# Patient Record
Sex: Female | Born: 1945 | Race: White | Hispanic: No | Marital: Married | Smoking: Former smoker
Health system: Southern US, Community
[De-identification: ages and names within clinical notes are randomized; demographics above are authoritative.]

## PROBLEM LIST (undated history)

## (undated) DIAGNOSIS — G40909 Epilepsy, unspecified, not intractable, without status epilepticus: Secondary | ICD-10-CM

## (undated) HISTORY — PX: FRACTURE SURGERY: SHX138

---

## 2019-11-28 ENCOUNTER — Other Ambulatory Visit: Payer: Self-pay

## 2019-11-28 ENCOUNTER — Emergency Department: Payer: Medicare Other

## 2019-11-28 ENCOUNTER — Inpatient Hospital Stay
Admission: EM | Admit: 2019-11-28 | Discharge: 2019-12-03 | DRG: 871 | Disposition: A | Discharge status: Skilled Nursing Facility | Payer: Medicare Other | Attending: Internal Medicine | Admitting: Internal Medicine

## 2019-11-28 ENCOUNTER — Encounter: Payer: Self-pay | Admitting: *Deleted

## 2019-11-28 DIAGNOSIS — G40909 Epilepsy, unspecified, not intractable, without status epilepticus: Secondary | ICD-10-CM | POA: Diagnosis present

## 2019-11-28 DIAGNOSIS — G9341 Metabolic encephalopathy: Secondary | ICD-10-CM

## 2019-11-28 DIAGNOSIS — R6521 Severe sepsis with septic shock: Secondary | ICD-10-CM

## 2019-11-28 DIAGNOSIS — J9811 Atelectasis: Secondary | ICD-10-CM | POA: Diagnosis present

## 2019-11-28 DIAGNOSIS — J9601 Acute respiratory failure with hypoxia: Secondary | ICD-10-CM | POA: Diagnosis present

## 2019-11-28 DIAGNOSIS — I82409 Acute embolism and thrombosis of unspecified deep veins of unspecified lower extremity: Secondary | ICD-10-CM | POA: Diagnosis present

## 2019-11-28 DIAGNOSIS — Z87891 Personal history of nicotine dependence: Secondary | ICD-10-CM

## 2019-11-28 DIAGNOSIS — F0391 Unspecified dementia with behavioral disturbance: Secondary | ICD-10-CM

## 2019-11-28 DIAGNOSIS — E872 Acidosis, unspecified: Secondary | ICD-10-CM

## 2019-11-28 DIAGNOSIS — G8929 Other chronic pain: Secondary | ICD-10-CM | POA: Diagnosis present

## 2019-11-28 DIAGNOSIS — Z86718 Personal history of other venous thrombosis and embolism: Secondary | ICD-10-CM

## 2019-11-28 DIAGNOSIS — Z7901 Long term (current) use of anticoagulants: Secondary | ICD-10-CM

## 2019-11-28 DIAGNOSIS — E876 Hypokalemia: Secondary | ICD-10-CM | POA: Diagnosis not present

## 2019-11-28 DIAGNOSIS — E860 Dehydration: Secondary | ICD-10-CM | POA: Diagnosis present

## 2019-11-28 DIAGNOSIS — R6520 Severe sepsis without septic shock: Secondary | ICD-10-CM

## 2019-11-28 DIAGNOSIS — N179 Acute kidney failure, unspecified: Secondary | ICD-10-CM | POA: Diagnosis present

## 2019-11-28 DIAGNOSIS — N390 Urinary tract infection, site not specified: Secondary | ICD-10-CM | POA: Diagnosis present

## 2019-11-28 DIAGNOSIS — Z20822 Contact with and (suspected) exposure to covid-19: Secondary | ICD-10-CM | POA: Diagnosis present

## 2019-11-28 DIAGNOSIS — I82402 Acute embolism and thrombosis of unspecified deep veins of left lower extremity: Secondary | ICD-10-CM | POA: Diagnosis present

## 2019-11-28 DIAGNOSIS — D6862 Lupus anticoagulant syndrome: Secondary | ICD-10-CM

## 2019-11-28 DIAGNOSIS — I824Y9 Acute embolism and thrombosis of unspecified deep veins of unspecified proximal lower extremity: Secondary | ICD-10-CM

## 2019-11-28 DIAGNOSIS — Z8669 Personal history of other diseases of the nervous system and sense organs: Secondary | ICD-10-CM | POA: Diagnosis not present

## 2019-11-28 DIAGNOSIS — L8990 Pressure ulcer of unspecified site, unspecified stage: Secondary | ICD-10-CM | POA: Insufficient documentation

## 2019-11-28 DIAGNOSIS — B9620 Unspecified Escherichia coli [E. coli] as the cause of diseases classified elsewhere: Secondary | ICD-10-CM | POA: Diagnosis present

## 2019-11-28 DIAGNOSIS — Z87440 Personal history of urinary (tract) infections: Secondary | ICD-10-CM | POA: Diagnosis not present

## 2019-11-28 DIAGNOSIS — R9431 Abnormal electrocardiogram [ECG] [EKG]: Secondary | ICD-10-CM

## 2019-11-28 DIAGNOSIS — Z79899 Other long term (current) drug therapy: Secondary | ICD-10-CM

## 2019-11-28 DIAGNOSIS — D539 Nutritional anemia, unspecified: Secondary | ICD-10-CM | POA: Diagnosis present

## 2019-11-28 DIAGNOSIS — T420X5A Adverse effect of hydantoin derivatives, initial encounter: Secondary | ICD-10-CM | POA: Diagnosis present

## 2019-11-28 DIAGNOSIS — J811 Chronic pulmonary edema: Secondary | ICD-10-CM | POA: Diagnosis present

## 2019-11-28 DIAGNOSIS — F03918 Unspecified dementia, unspecified severity, with other behavioral disturbance: Secondary | ICD-10-CM | POA: Diagnosis present

## 2019-11-28 DIAGNOSIS — A419 Sepsis, unspecified organism: Principal | ICD-10-CM

## 2019-11-28 HISTORY — DX: Epilepsy, unspecified, not intractable, without status epilepticus: G40.909

## 2019-11-28 LAB — URINALYSIS, COMPLETE (UACMP) WITH MICROSCOPIC
Bilirubin Urine: NEGATIVE
Glucose, UA: NEGATIVE mg/dL
Ketones, ur: NEGATIVE mg/dL
Nitrite: NEGATIVE
Protein, ur: 100 mg/dL — AB
Specific Gravity, Urine: 1.012 (ref 1.005–1.030)
Squamous Epithelial / HPF: NONE SEEN (ref 0–5)
WBC, UA: >50 WBC/hpf — ABNORMAL HIGH (ref 0–5)
pH: 6 (ref 5.0–8.0)

## 2019-11-28 LAB — CBC WITH DIFFERENTIAL/PLATELET
Abs Immature Granulocytes: 0.03 10*3/uL (ref 0.00–0.07)
Basophils Absolute: 0 10*3/uL (ref 0.0–0.1)
Basophils Relative: 0 %
Eosinophils Absolute: 0 10*3/uL (ref 0.0–0.5)
Eosinophils Relative: 0 %
HCT: 35.8 % — ABNORMAL LOW (ref 36.0–46.0)
Hemoglobin: 11.6 g/dL — ABNORMAL LOW (ref 12.0–15.0)
Immature Granulocytes: 0 %
Lymphocytes Relative: 4 %
Lymphs Abs: 0.3 10*3/uL — ABNORMAL LOW (ref 0.7–4.0)
MCH: 34.2 pg — ABNORMAL HIGH (ref 26.0–34.0)
MCHC: 32.4 g/dL (ref 30.0–36.0)
MCV: 105.6 fL — ABNORMAL HIGH (ref 80.0–100.0)
Monocytes Absolute: 0.1 10*3/uL (ref 0.1–1.0)
Monocytes Relative: 1 %
Neutro Abs: 7.7 10*3/uL (ref 1.7–7.7)
Neutrophils Relative %: 95 %
Platelets: 367 10*3/uL (ref 150–400)
RBC: 3.39 MIL/uL — ABNORMAL LOW (ref 3.87–5.11)
RDW: 12.8 % (ref 11.5–15.5)
WBC: 8.2 10*3/uL (ref 4.0–10.5)
nRBC: 0 % (ref 0.0–0.2)

## 2019-11-28 LAB — COMPREHENSIVE METABOLIC PANEL
ALT: 43 U/L (ref 0–44)
AST: 87 U/L — ABNORMAL HIGH (ref 15–41)
Albumin: 3.7 g/dL (ref 3.5–5.0)
Alkaline Phosphatase: 145 U/L — ABNORMAL HIGH (ref 38–126)
Anion gap: 17 — ABNORMAL HIGH (ref 5–15)
BUN: 24 mg/dL — ABNORMAL HIGH (ref 8–23)
CO2: 19 mmol/L — ABNORMAL LOW (ref 22–32)
Calcium: 12.2 mg/dL — ABNORMAL HIGH (ref 8.9–10.3)
Chloride: 101 mmol/L (ref 98–111)
Creatinine, Ser: 1.45 mg/dL — ABNORMAL HIGH (ref 0.44–1.00)
GFR, Estimated: 38 mL/min — ABNORMAL LOW (ref >60)
Glucose, Bld: 94 mg/dL (ref 70–99)
Potassium: 4.7 mmol/L (ref 3.5–5.1)
Sodium: 137 mmol/L (ref 135–145)
Total Bilirubin: 0.8 mg/dL (ref 0.3–1.2)
Total Protein: 8.1 g/dL (ref 6.5–8.1)

## 2019-11-28 LAB — PROTIME-INR
INR: 1.4 — ABNORMAL HIGH (ref 0.8–1.2)
Prothrombin Time: 16.7 seconds — ABNORMAL HIGH (ref 11.4–15.2)

## 2019-11-28 LAB — LACTIC ACID, PLASMA
Lactic Acid, Venous: 5.7 mmol/L (ref 0.5–1.9)
Lactic Acid, Venous: 3.8 mmol/L (ref 0.5–1.9)

## 2019-11-28 LAB — APTT: aPTT: <24 seconds — ABNORMAL LOW (ref 24–36)

## 2019-11-28 MED ORDER — IOHEXOL 350 MG/ML SOLN
60.0000 mL | Freq: Once | INTRAVENOUS | Status: AC | PRN
  Administered 2019-11-28: 60 mL via INTRAVENOUS

## 2019-11-28 MED ORDER — ACETAMINOPHEN 650 MG RE SUPP
650.0000 mg | Freq: Four times a day (QID) | RECTAL | Status: DC | PRN
  Administered 2019-11-29: 650 mg via RECTAL
  Filled 2019-11-28: qty 1

## 2019-11-28 MED ORDER — ACETAMINOPHEN 650 MG RE SUPP
650.0000 mg | Freq: Once | RECTAL | Status: AC
  Administered 2019-11-28: 650 mg via RECTAL
  Filled 2019-11-28: qty 1

## 2019-11-28 MED ORDER — VANCOMYCIN HCL IN DEXTROSE 1-5 GM/200ML-% IV SOLN
1000.0000 mg | Freq: Once | INTRAVENOUS | Status: AC
  Administered 2019-11-28: 1000 mg via INTRAVENOUS
  Filled 2019-11-28: qty 200

## 2019-11-28 MED ORDER — HEPARIN BOLUS VIA INFUSION
2000.0000 [IU] | Freq: Once | INTRAVENOUS | Status: AC
  Administered 2019-11-29: 2000 [IU] via INTRAVENOUS
  Filled 2019-11-28: qty 2000

## 2019-11-28 MED ORDER — SODIUM CHLORIDE 0.9 % IV SOLN
2.0000 g | Freq: Once | INTRAVENOUS | Status: AC
  Administered 2019-11-28: 2 g via INTRAVENOUS
  Filled 2019-11-28: qty 2

## 2019-11-28 MED ORDER — ACETAMINOPHEN 325 MG PO TABS: 650.0000 mg | ORAL_TABLET | Freq: Four times a day (QID) | ORAL | Status: DC | PRN

## 2019-11-28 MED ORDER — PHENOBARBITAL 32.4 MG PO TABS
64.8000 mg | ORAL_TABLET | Freq: Two times a day (BID) | ORAL | Status: DC
  Filled 2019-11-28: qty 2

## 2019-11-28 MED ORDER — HEPARIN (PORCINE) 25000 UT/250ML-% IV SOLN
1300.0000 [IU]/h | INTRAVENOUS | Status: DC
  Administered 2019-11-29: 1200 [IU]/h via INTRAVENOUS
  Administered 2019-11-29: 1100 [IU]/h via INTRAVENOUS
  Administered 2019-11-30: 900 [IU]/h via INTRAVENOUS
  Administered 2019-12-01: 1050 [IU]/h via INTRAVENOUS
  Administered 2019-12-02: 1100 [IU]/h via INTRAVENOUS
  Filled 2019-11-28 (×5): qty 250

## 2019-11-28 MED ORDER — LACTATED RINGERS IV BOLUS (SEPSIS)
2000.0000 mL | Freq: Once | INTRAVENOUS | Status: AC
  Administered 2019-11-28: 2000 mL via INTRAVENOUS

## 2019-11-28 MED ORDER — ONDANSETRON HCL 4 MG/2ML IJ SOLN: 4.0000 mg | Freq: Once | INTRAMUSCULAR | Status: DC

## 2019-11-28 MED ORDER — ONDANSETRON HCL 4 MG/2ML IJ SOLN
4.0000 mg | Freq: Once | INTRAMUSCULAR | Status: AC
  Administered 2019-11-28: 4 mg via INTRAVENOUS

## 2019-11-28 MED ORDER — METRONIDAZOLE IN NACL 5-0.79 MG/ML-% IV SOLN
500.0000 mg | Freq: Once | INTRAVENOUS | Status: AC
  Administered 2019-11-28: 500 mg via INTRAVENOUS
  Filled 2019-11-28: qty 100

## 2019-11-28 MED ORDER — CINACALCET HCL 30 MG PO TABS
30.0000 mg | ORAL_TABLET | Freq: Two times a day (BID) | ORAL | Status: DC
  Administered 2019-11-29: 30 mg via ORAL
  Filled 2019-11-28 (×6): qty 1

## 2019-11-28 MED ORDER — SODIUM CHLORIDE 0.9 % IV SOLN: INTRAVENOUS | Status: DC

## 2019-11-28 MED ORDER — METRONIDAZOLE IN NACL 5-0.79 MG/ML-% IV SOLN: 500.0000 mg | Freq: Three times a day (TID) | INTRAVENOUS | Status: DC

## 2019-11-28 MED ORDER — ONDANSETRON HCL 4 MG/2ML IJ SOLN
INTRAMUSCULAR | Status: AC
  Filled 2019-11-28: qty 2

## 2019-11-28 MED ORDER — PHENYTOIN SODIUM EXTENDED 100 MG PO CAPS: 100.0000 mg | ORAL_CAPSULE | Freq: Two times a day (BID) | ORAL | Status: DC

## 2019-11-28 MED ORDER — MIDODRINE HCL 5 MG PO TABS
10.0000 mg | ORAL_TABLET | Freq: Once | ORAL | Status: DC
  Filled 2019-11-28: qty 2

## 2019-11-28 MED ORDER — SODIUM CHLORIDE 0.9 % IV BOLUS
500.0000 mL | Freq: Once | INTRAVENOUS | Status: AC
  Administered 2019-11-28: 500 mL via INTRAVENOUS

## 2019-11-28 NOTE — ED Notes (Signed)
Pt continues to moan out.

## 2019-11-28 NOTE — ED Provider Notes (Signed)
[facility] Emergency Department Provider Note  ____________________________________________   First MD Initiated Contact with Patient 11/28/19 1835     (approximate)  I have reviewed the triage vital signs and the nursing notes.   HISTORY  Chief Complaint Fever    HPI Shannon Kelley is a 74 y.o. female  With reported PMHx recent diagnosis of DVT of LLE, here from [facility] with sob, hypotension. Per report, pt has been c/o leg pain more than usual over the past several days. Today, she has now become febrile, tachycardic, and hypotensive with [facility]. They checked her O2 and it was in the 70s, so EMS was called and pt brought here. With EMS, pt was hypoxic, hypotensive and altered. On my assessment, she only complains of left leg pain which is "horrible." Unable to further elaborate. Denies any CP. She has been vomiting per EMS as well, though she does not report this on interview. Denies overt abd pain.  Level 5 caveat invoked as remainder of history, ROS, and physical exam limited due to patient's confusion, acuity.        History reviewed. No pertinent past medical history.   Dementia Frequent UTIs H/o DVT Lupus anticoagulant syndrome  PSHx:  Non contributory  FHx: Lupus anticoagulant  There are no problems to display for this patient.    Prior to Admission medications   Not on File    Allergies Patient has no known allergies.  No family history on file.  Social History Social History   Tobacco Use  . Smoking status: Never Smoker  . Smokeless tobacco: Never Used  Substance Use Topics  . Alcohol use: Not Currently  . Drug use: Not Currently    Review of Systems  Review of Systems  Unable to perform ROS: Mental status change  Constitutional: Positive for chills and fever.  Gastrointestinal: Positive for vomiting.  Musculoskeletal: Positive for arthralgias and gait problem.  Neurological: Positive for weakness.      ____________________________________________  PHYSICAL EXAM:      VITAL SIGNS: ED Triage Vitals  Enc Vitals Group     BP      Pulse      Resp      Temp      Temp src      SpO2      Weight      Height      Head Circumference      [facility] Flow      Pain Score      Pain Loc      Pain Edu?      Excl. in [place]?      Physical Exam Vitals and nursing note reviewed.  Constitutional:      General: She is not in acute distress.    Appearance: She is well-developed. She is obese. She is ill-appearing.  HENT:     Head: Normocephalic and atraumatic.     Mouth/Throat:     Mouth: Mucous membranes are dry.  Eyes:     Conjunctiva/sclera: Conjunctivae normal.  Cardiovascular:     Rate and Rhythm: Regular rhythm. Tachycardia present.     Heart sounds: Normal heart sounds. No murmur heard.  No friction rub.  Pulmonary:     Effort: Tachypnea and respiratory distress present.     Breath sounds: Normal breath sounds. No wheezing or rales.  Abdominal:     General: There is no distension.     Palpations: Abdomen is soft.     Tenderness:  There is generalized abdominal tenderness.  Musculoskeletal:     Cervical back: Neck supple.     Comments: TTP over bilateral LE, slightly worse on left. No overt asymmetric edema. No redness. Pulses 2+ and symmetric.  Skin:    General: Skin is warm.     Capillary Refill: Capillary refill takes less than 2 seconds.  Neurological:     Mental Status: She is alert and oriented to person, place, and time.     Motor: No abnormal muscle tone.       ____________________________________________   LABS (all labs ordered are listed, but only abnormal results are displayed)  Labs Reviewed  LACTIC ACID, PLASMA - Abnormal; Notable for the following components:      Result Value   Lactic Acid, Venous 5.7 (*)    All other components within normal limits  COMPREHENSIVE METABOLIC PANEL - Abnormal; Notable for the following components:   CO2 19 (*)    BUN 24  (*)    Creatinine, Ser 1.45 (*)    Calcium 12.2 (*)    AST 87 (*)    Alkaline Phosphatase 145 (*)    GFR, Estimated 38 (*)    Anion gap 17 (*)    All other components within normal limits  CBC WITH DIFFERENTIAL/PLATELET - Abnormal; Notable for the following components:   RBC 3.39 (*)    Hemoglobin 11.6 (*)    HCT 35.8 (*)    MCV 105.6 (*)    MCH 34.2 (*)    Lymphs Abs 0.3 (*)    All other components within normal limits  PROTIME-INR - Abnormal; Notable for the following components:   Prothrombin Time 16.7 (*)    INR 1.4 (*)    All other components within normal limits  APTT - Abnormal; Notable for the following components:   aPTT <24 (*)    All other components within normal limits  URINALYSIS, COMPLETE (UACMP) WITH MICROSCOPIC - Abnormal; Notable for the following components:   Color, Urine YELLOW (*)    APPearance TURBID (*)    Hgb urine dipstick MODERATE (*)    Protein, ur 100 (*)    Leukocytes,Ua MODERATE (*)    WBC, UA >50 (*)    Bacteria, UA MANY (*)    All other components within normal limits  CULTURE, BLOOD (ROUTINE X 2)  CULTURE, BLOOD (ROUTINE X 2)  URINE CULTURE  LACTIC ACID, PLASMA    ____________________________________________  EKG: Sinus tachycardia vs SVT, VR 153. QRS 76, QTc 506. Non-specific TW changes diffusely. No overt ST elevations. Findings c/w strain pattern. ________________________________________  RADIOLOGY All imaging, including plain films, CT scans, and ultrasounds, independently reviewed by me, and interpretations confirmed via formal radiology reads.  ED MD interpretation:   CT Angio Chest: Possible old PE, no acute PE identified, no PNA CT A/P: No surgical abnormality, non calcified gallstone noted  Official radiology report(s): CT Angio Chest PE W and/or Wo Contrast  Result Date: 11/28/2019 CLINICAL DATA:  Fever, short of breath, abdominal pain EXAM: CT ANGIOGRAPHY CHEST CT ABDOMEN AND PELVIS WITH CONTRAST TECHNIQUE:  Multidetector CT imaging of the chest was performed using the standard protocol during bolus administration of intravenous contrast. Multiplanar CT image reconstructions and MIPs were obtained to evaluate the vascular anatomy. Multidetector CT imaging of the abdomen and pelvis was performed using the standard protocol during bolus administration of intravenous contrast. CONTRAST:  23m OMNIPAQUE IOHEXOL 350 MG/ML SOLN COMPARISON:  01/03/2009, 11/28/2019 FINDINGS: CTA CHEST FINDINGS Cardiovascular: This is a  technically adequate evaluation of the pulmonary vasculature. There are 2 small eccentric filling defects within the left lower lobe segmental pulmonary arteries, which have an appearance most consistent with sequela of chronic thromboembolic disease. This is best seen on image 132 of series 5 and image 141 of series 5. No other signs of additional filling defects or acute pulmonary emboli. The heart is unremarkable without pericardial effusion. Mediastinum/Nodes: No enlarged mediastinal, hilar, or axillary lymph nodes. Thyroid gland, trachea, and esophagus demonstrate no significant findings. Lungs/Pleura: No acute airspace disease, effusion, or pneumothorax. Scattered hypoventilatory changes versus scarring at the lung bases. Central airways are patent. Musculoskeletal: No acute displaced fracture. Sclerosis and invagination of the superior endplate of X44 consistent with chronic compression fracture. Reconstructed images demonstrate no additional findings. Review of the MIP images confirms the above findings. CT ABDOMEN and PELVIS FINDINGS Hepatobiliary: No focal liver abnormality is seen. Noncalcified gallstones seen within the gallbladder neck, best visualized on delayed images. No gallbladder wall thickening or signs of acute cholecystitis. Pancreas: Unremarkable. No pancreatic ductal dilatation or surrounding inflammatory changes. Spleen: Normal in size without focal abnormality. Adrenals/Urinary Tract:  Small left renal cortical cysts are noted. Mild bilateral renal cortical atrophy. Punctate calcifications are seen at the renal hila, likely vascular. No definite urinary tract calculi or obstructive uropathy. The adrenals and bladder are unremarkable. Stomach/Bowel: No bowel obstruction or ileus. Normal appendix right lower quadrant. No bowel wall thickening or inflammatory change. Vascular/Lymphatic: Aortic atherosclerosis. No enlarged abdominal or pelvic lymph nodes. Reproductive: Uterus and bilateral adnexa are unremarkable. Other: No free fluid or free gas. No abdominal wall hernia. Musculoskeletal: Chronic appearing compression deformities are seen at T11 and L1. There are no acute displaced fractures. Bilateral hip arthroplasties are unremarkable. Reconstructed images demonstrate no additional findings. Review of the MIP images confirms the above findings. IMPRESSION: 1. Small eccentric filling defects within the left lower lobe segmental pulmonary arteries, which have an appearance most consistent with sequela of chronic thromboembolic disease. No signs of acute pulmonary emboli. 2. Noncalcified gallstone.  No evidence of acute cholecystitis. 3. Aortic Atherosclerosis (ICD10-I70.0). Electronically Signed   By: Randa Ngo M.D.   On: 11/28/2019 20:21   CT ABDOMEN PELVIS W CONTRAST  Result Date: 11/28/2019 CLINICAL DATA:  Fever, short of breath, abdominal pain EXAM: CT ANGIOGRAPHY CHEST CT ABDOMEN AND PELVIS WITH CONTRAST TECHNIQUE: Multidetector CT imaging of the chest was performed using the standard protocol during bolus administration of intravenous contrast. Multiplanar CT image reconstructions and MIPs were obtained to evaluate the vascular anatomy. Multidetector CT imaging of the abdomen and pelvis was performed using the standard protocol during bolus administration of intravenous contrast. CONTRAST:  52m OMNIPAQUE IOHEXOL 350 MG/ML SOLN COMPARISON:  01/03/2009, 11/28/2019 FINDINGS: CTA CHEST  FINDINGS Cardiovascular: This is a technically adequate evaluation of the pulmonary vasculature. There are 2 small eccentric filling defects within the left lower lobe segmental pulmonary arteries, which have an appearance most consistent with sequela of chronic thromboembolic disease. This is best seen on image 132 of series 5 and image 141 of series 5. No other signs of additional filling defects or acute pulmonary emboli. The heart is unremarkable without pericardial effusion. Mediastinum/Nodes: No enlarged mediastinal, hilar, or axillary lymph nodes. Thyroid gland, trachea, and esophagus demonstrate no significant findings. Lungs/Pleura: No acute airspace disease, effusion, or pneumothorax. Scattered hypoventilatory changes versus scarring at the lung bases. Central airways are patent. Musculoskeletal: No acute displaced fracture. Sclerosis and invagination of the superior endplate of TY18consistent with chronic compression  fracture. Reconstructed images demonstrate no additional findings. Review of the MIP images confirms the above findings. CT ABDOMEN and PELVIS FINDINGS Hepatobiliary: No focal liver abnormality is seen. Noncalcified gallstones seen within the gallbladder neck, best visualized on delayed images. No gallbladder wall thickening or signs of acute cholecystitis. Pancreas: Unremarkable. No pancreatic ductal dilatation or surrounding inflammatory changes. Spleen: Normal in size without focal abnormality. Adrenals/Urinary Tract: Small left renal cortical cysts are noted. Mild bilateral renal cortical atrophy. Punctate calcifications are seen at the renal hila, likely vascular. No definite urinary tract calculi or obstructive uropathy. The adrenals and bladder are unremarkable. Stomach/Bowel: No bowel obstruction or ileus. Normal appendix right lower quadrant. No bowel wall thickening or inflammatory change. Vascular/Lymphatic: Aortic atherosclerosis. No enlarged abdominal or pelvic lymph nodes.  Reproductive: Uterus and bilateral adnexa are unremarkable. Other: No free fluid or free gas. No abdominal wall hernia. Musculoskeletal: Chronic appearing compression deformities are seen at T11 and L1. There are no acute displaced fractures. Bilateral hip arthroplasties are unremarkable. Reconstructed images demonstrate no additional findings. Review of the MIP images confirms the above findings. IMPRESSION: 1. Small eccentric filling defects within the left lower lobe segmental pulmonary arteries, which have an appearance most consistent with sequela of chronic thromboembolic disease. No signs of acute pulmonary emboli. 2. Noncalcified gallstone.  No evidence of acute cholecystitis. 3. Aortic Atherosclerosis (ICD10-I70.0). Electronically Signed   By: Randa Ngo M.D.   On: 11/28/2019 20:21   DG Chest Port 1 View  Result Date: 11/28/2019 CLINICAL DATA:  Questionable sepsis.  Evaluate for pneumonia. EXAM: PORTABLE CHEST 1 VIEW COMPARISON:  03/30/2017 FINDINGS: The stable cardiomediastinal contours. No pleural effusion identified. No airspace consolidation identified to suggest pneumonia. Healing left distal clavicle fracture. IMPRESSION: No active disease. Electronically Signed   By: Kerby Moors M.D.   On: 11/28/2019 19:31    ____________________________________________  PROCEDURES   Procedure(s) performed (including Critical Care):  .Critical Care Performed by: Duffy Bruce, MD Authorized by: Duffy Bruce, MD   Critical care provider statement:    Critical care time (minutes):  35   Critical care time was exclusive of:  Separately billable procedures and treating other patients and teaching time   Critical care was necessary to treat or prevent imminent or life-threatening deterioration of the following conditions:  Cardiac failure, circulatory failure and respiratory failure   Critical care was time spent personally by me on the following activities:  Development of treatment  plan with patient or surrogate, discussions with consultants, evaluation of patient's response to treatment, examination of patient, obtaining history from patient or surrogate, ordering and performing treatments and interventions, ordering and review of laboratory studies, ordering and review of radiographic studies, pulse oximetry, re-evaluation of patient's condition and review of old charts   I assumed direction of critical care for this patient from another provider in my specialty: no      ____________________________________________  INITIAL IMPRESSION / MDM / [place] / ED COURSE  As part of my medical decision making, I reviewed the following data within the [facility] notes reviewed and incorporated, Old chart reviewed, Notes from prior ED visits, and [place] Controlled Substance Database       *LADAYSHA SOUTAR was evaluated in Emergency Department on 11/28/2019 for the symptoms described in the history of present illness. She was evaluated in the context of the global COVID-19 pandemic, which necessitated consideration that the patient might be at risk for infection with the SARS-CoV-2 virus that causes COVID-19.  Institutional protocols and algorithms that pertain to the evaluation of patients at risk for COVID-19 are in a state of rapid change based on information released by regulatory bodies including the CDC and federal and state organizations. These policies and algorithms were followed during the patient's care in the ED.  Some ED evaluations and interventions may be delayed as a result of limited staffing during the pandemic.*     Medical Decision Making: 74 year old female here with reported altered mental status, fever, and shortness of breath.  On arrival, patient febrile, tachycardic, hypotensive.  Concern for severe sepsis or septic shock.  Patient initiated on broad-spectrum antibiotics with 30 cc/kg fluid resuscitation.  Initial lab work  shows normal white blood cell count but significant lactic acidosis and likely mild AKI.  Slight AST and alk phos elevation noted.  Abdomen is soft.  UA is consistent with UTI and she has a history of recurrent UTIs.  Chest x-ray without pneumonia.  Given the degree of her lactic acidosis and discomfort, CTs of the chest as well as abdomen and pelvis obtained, and showed no evidence of surgical abnormality.  She is no evidence of acute PE.  Her primary complaint of leg pain is likely due to her known DVT and she has no evidence to suggest phlegmasia or vascular compromise in this extremity.  Will continue fluid resuscitation, antibiotics, and admit.  On tele, pt initially with sinus tach HR 150s, improving to 100s after fluids and fever control.  ____________________________________________  FINAL CLINICAL IMPRESSION(S) / ED DIAGNOSES  Final diagnoses:  Sepsis secondary to UTI ([place])  AKI (acute kidney injury) ([place])  Lactic acidosis  Lupus anticoagulant syndrome ([place])     MEDICATIONS GIVEN DURING THIS VISIT:  Medications  midodrine (PROAMATINE) tablet 10 mg (has no administration in time range)  lactated ringers bolus 2,000 mL (2,000 mLs Intravenous New Bag/Given 11/28/19 1845)  ceFEPIme (MAXIPIME) 2 g in sodium chloride 0.9 % 100 mL IVPB (0 g Intravenous Stopped 11/28/19 1930)  metroNIDAZOLE (FLAGYL) IVPB 500 mg (0 mg Intravenous Stopped 11/28/19 1930)  vancomycin (VANCOCIN) IVPB 1000 mg/200 mL premix (0 mg Intravenous Stopped 11/28/19 2054)  acetaminophen (TYLENOL) suppository 650 mg (650 mg Rectal Given 11/28/19 1847)  sodium chloride 0.9 % bolus 500 mL (0 mLs Intravenous Stopped 11/28/19 2055)  ondansetron (ZOFRAN) injection 4 mg (4 mg Intravenous Given 11/28/19 1932)  iohexol (OMNIPAQUE) 350 MG/ML injection 60 mL (60 mLs Intravenous Contrast Given 11/28/19 1950)     ED Discharge Orders    None       Note:  This document was prepared using Dragon voice recognition software and  may include unintentional dictation errors.   Duffy Bruce, MD 11/28/19 2131

## 2019-11-28 NOTE — ED Notes (Signed)
Report off to zach rn  

## 2019-11-28 NOTE — ED Notes (Signed)
U/s in with pt.  Pt continues to moan out.  Iv fluids infusing.

## 2019-11-28 NOTE — ED Notes (Signed)
Pt awake and moaning out out.  Answers questions appr.  Iv fluids and meds infusing.  siderails up x 2   Sinus tach on monitor.

## 2019-11-28 NOTE — Progress Notes (Signed)
CODE SEPSIS - PHARMACY COMMUNICATION  **Broad Spectrum Antibiotics should be administered within 1 hour of Sepsis diagnosis**  Time Code Sepsis Called/Page Received: 1837  Antibiotics Ordered:  Vancomycin 1000mg  IV x1 Cefepime 2g IV x1 Metronidazole 500mg  IV x1  Time of 1st antibiotic administration:    , PharmD Pharmacy Resident  11/28/2019 6:41 PM

## 2019-11-28 NOTE — ED Triage Notes (Signed)
Pt brought in via [facility] from [facility] with fever and sob.  Pt moaning and yelling out on arrival   nonrebreather in place.  No iv on arrival  Fever 102 per ems  md at bedside

## 2019-11-28 NOTE — Progress Notes (Signed)
ANTICOAGULATION CONSULT NOTE - Initial Consult  Pharmacy Consult for Heparin Indication: VTE treatment  No Known Allergies  Patient Measurements: Height: 5\' 6"  (167.6 cm) Weight: 74.8 kg (165 lb) IBW/kg (Calculated) : 59.3 HEPARIN DW (KG): 74.3  Vital Signs: Temp: 100.1 F (37.8 C) (11/10 2136) Temp Source: Oral (11/10 2136) BP: 135/125 (11/10 2145) Pulse Rate: 114 (11/10 2145)  Labs: Recent Labs    11/28/19 1836  HGB 11.6*  HCT 35.8*  PLT 367  APTT <24*  LABPROT 16.7*  INR 1.4*  CREATININE 1.45*   Estimated Creatinine Clearance: 35.2 mL/min (A) (by C-G formula based on SCr of 1.45 mg/dL (H)).  Medical History: Past Medical History:  Diagnosis Date  . Epilepsy (HCC)     Medications:  (Not in a hospital admission)   Assessment: Baseline INR = 1.4, aPTT = < 24, H/H low, PLTs OK.  Initiating Heparin for VTE treatment.  Pt has been on Warfarin  Goal of Therapy:  Heparin level 0.3-0.7 units/ml Monitor platelets by anticoagulation protocol: Yes   Plan:  Heparin bolus 2000 units x 1 then infusion at 1200 units/hr Check HL in 8 hours CBC daily per protocol  13/10/21 A 11/28/2019,10:26 PM

## 2019-11-28 NOTE — H&P (Signed)
Shannon Kelley [facility]:[id number] DOB: [birth year] DOA: 11/28/2019   PCP: Chelsea Primus, MD        Patient arrived to ER on 11/28/19 at 1831 Referred by Attending Duffy Bruce, MD   Patient coming from:   From facility  [facility]  Chief Complaint:  Chief Complaint  Patient presents with  . Fever    HPI: Shannon Kelley is a 74 y.o. 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, 1988, 1992)  Dementia     Presented with   fever shortness of breath moaning yelling out Started on non-rebreather Noted to be fever up to 102 per EMS Notes stated that patient has been complaining of of leg pain recently SNF checked her oxygen and it was in the 70s called EMS and started her on nonrebreather Patient was noted to be hypoxic hypotensive and altered Patient states recently she was diagnosed with DVT and her coumadin dose was increased she was supposed to be switched to Lovenox but family is unsure if she got her dose  Last admission was in end of September for elevated calcium up to 13 at that time patient was showing agitation crying out Found to have phenytoin toxicity Levels improved with IV fluid resuscitation PTH was noted to be elevated to 59 vitamin D was at goal She was supposed to start on Sensipar 30 mg twice daily For her seizure medications after discharge from [facility] was changed back to her baseline phenytoin 2548m AM and 1072mat bedtime, and Phenobarbital 64.48m248mid. Recommendations to continue to follow phenytoin levels twice weekly until stable She was supposed to use Atarax and Seroquel for agitation At her baseline patient walks with a walker Her blood pressure usually runs low 180s to 90s especially when she was doing therapy Over the past year she has been losing a lot of weight  Has not walked since April when she broke her hip  Infectious risk  factors:  Reports fever,     Has   been vaccinated against COVID Moderna last dose October 2021   Initial COVID TEST  NEGATIVE   Lab Results  Component Value Date   [place] 11/28/2019     Regarding pertinent Chronic problems:   Seizure disorder on phenobarbital and phenytoin   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 Critical Care  . Blood Culture (routine x 2)  . Urine culture  . Respiratory Panel by RT PCR (Flu A&B, Covid) - Nasopharyngeal Swab  . DG Chest Port 1 View  . CT Angio Chest PE W and/or Wo Contrast  . CT ABDOMEN PELVIS W CONTRAST  . US Koreanous Img Lower Unilateral Left  . Lactic acid, plasma  . Comprehensive metabolic panel  . CBC WITH DIFFERENTIAL  . Protime-INR  . APTT  . Urinalysis, Complete w Microscopic  .  Diet NPO time specified  . Cardiac monitoring  . Document height and weight  . Assess and Document Glasgow Coma Scale  . Document vital signs within 1-hour of fluid bolus completion. Notify provider of abnormal vital signs despite fluid resuscitation.  . DO NOT delay antibiotics if unable to obtain blood culture.  . Refer to Sidebar Report: Sepsis Sidebar ED/IP  . Notify provider for difficulties obtaining IV access.  . Insert peripheral IV x 2  . Initiate Carrier Fluid Protocol  . In and Out Cath  . Code Sepsis activation.  This occurs automatically when order is  signed and prioritizes pharmacy, lab, and radiology services for STAT collections and interventions.  If [facility] downtime, call [facility] [phone]) to activate Code Sepsis.  . Consult to hospitalist  ALL PATIENTS BEING ADMITTED/HAVING PROCEDURES NEED COVID-19 SCREENING  . Pulse oximetry, continuous  . ED EKG 12-Lead  . ED EKG  . EKG 12-Lead     Following Medications were ordered in ER: Medications  midodrine (PROAMATINE) tablet 10 mg (has no administration in time range)  lactated ringers bolus 2,000 mL (0 mLs Intravenous Stopped 11/28/19 2137)  ceFEPIme (MAXIPIME) 2 g in sodium chloride 0.9 % 100 mL IVPB (0 g Intravenous Stopped 11/28/19 1930)  metroNIDAZOLE (FLAGYL) IVPB 500 mg (0 mg Intravenous Stopped 11/28/19 1930)  vancomycin (VANCOCIN) IVPB 1000 mg/200 mL premix (0 mg Intravenous Stopped 11/28/19 2054)  acetaminophen (TYLENOL) suppository 650 mg (650 mg Rectal Given 11/28/19 1847)  sodium chloride 0.9 % bolus 500 mL (0 mLs Intravenous Stopped 11/28/19 2055)  ondansetron (ZOFRAN) injection 4 mg (4 mg Intravenous Given 11/28/19 1932)  iohexol (OMNIPAQUE) 350 MG/ML injection 60 mL (60 mLs Intravenous Contrast Given 11/28/19 1950)        Consult Orders  (From admission, onward)         Start     Ordered   11/28/19 2128  Consult to hospitalist  ALL PATIENTS BEING ADMITTED/HAVING PROCEDURES NEED COVID-19 SCREENING  Once       Comments: ALL PATIENTS BEING ADMITTED/HAVING PROCEDURES NEED COVID-19 SCREENING  Provider:  (Not yet assigned)  Question Answer Comment  Place call to: Hospitalist   Reason for Consult Admit      11/28/19 2127          Significant initial  Findings: Abnormal Labs Reviewed  LACTIC ACID, PLASMA - Abnormal; Notable for the following components:      Result Value   Lactic Acid, Venous 5.7 (*)    All other components within normal limits  LACTIC ACID, PLASMA - Abnormal; Notable for the following components:   Lactic Acid, Venous 3.8 (*)    All other  components within normal limits  COMPREHENSIVE METABOLIC PANEL - Abnormal; Notable for the following components:   CO2 19 (*)    BUN 24 (*)    Creatinine, Ser 1.45 (*)    Calcium 12.2 (*)    AST 87 (*)    Alkaline Phosphatase 145 (*)    GFR, Estimated 38 (*)    Anion gap 17 (*)    All other components within normal limits  CBC WITH DIFFERENTIAL/PLATELET - Abnormal; Notable for the following components:   RBC 3.39 (*)    Hemoglobin 11.6 (*)    HCT 35.8 (*)    MCV 105.6 (*)    MCH 34.2 (*)    Lymphs Abs 0.3 (*)    All other components within normal limits  PROTIME-INR - Abnormal; Notable for the following components:   Prothrombin Time  16.7 (*)    INR 1.4 (*)    All other components within normal limits  APTT - Abnormal; Notable for the following components:   aPTT <24 (*)    All other components within normal limits  URINALYSIS, COMPLETE (UACMP) WITH MICROSCOPIC - Abnormal; Notable for the following components:   Color, Urine YELLOW (*)    APPearance TURBID (*)    Hgb urine dipstick MODERATE (*)    Protein, ur 100 (*)    Leukocytes,Ua MODERATE (*)    WBC, UA >50 (*)    Bacteria, UA MANY (*)    All other components within normal limits    Otherwise labs showing:    Recent Labs  Lab 11/28/19 1836  NA 137  K 4.7  CO2 19*  GLUCOSE 94  BUN 24*  CREATININE 1.45*  CALCIUM 12.2*    Cr    Up from baseline see below Lab Results  Component Value Date   CREATININE 1.45 (H) 11/28/2019    Recent Labs  Lab 11/28/19 1836  AST 87*  ALT 43  ALKPHOS 145*  BILITOT 0.8  PROT 8.1  ALBUMIN 3.7   Lab Results  Component Value Date   CALCIUM 12.2 (H) 11/28/2019   WBC      Component Value Date/Time   WBC 8.2 11/28/2019 1836   LYMPHSABS 0.3 (L) 11/28/2019 1836   MONOABS 0.1 11/28/2019 1836   EOSABS 0.0 11/28/2019 1836   BASOSABS 0.0 11/28/2019 1836   Plt: Lab Results  Component Value Date   PLT 367 11/28/2019    Lactic Acid, Venous    Component Value Date/Time    LATICACIDVEN 5.7 (HH) 11/28/2019 1836   Lactic Acid, Venous    Component Value Date/Time   LATICACIDVEN 5.1 (HH) 11/28/2019 2322       Procalcitonin   Ordered     Venous  Blood Gas result:  PH 7.46  pCO2 33   HG/HCT   stable,      Component Value Date/Time   HGB 11.6 (L) 11/28/2019 1836   HCT 35.8 (L) 11/28/2019 1836   MCV 105.6 (H) 11/28/2019 1836    ECG: Ordered Personally reviewed by me showing: HR : 153 Rhythm:  , Sinus tachycardia vs SVT  no evidence of ischemic changes QTC 506     UA  evidence of UTI    Urine analysis:    Component Value Date/Time   COLORURINE YELLOW (A) 11/28/2019 1836   APPEARANCEUR TURBID (A) 11/28/2019 1836   LABSPEC 1.012 11/28/2019 1836   PHURINE 6.0 11/28/2019 1836   GLUCOSEU NEGATIVE 11/28/2019 1836   HGBUR MODERATE (A) 11/28/2019 1836   BILIRUBINUR NEGATIVE 11/28/2019 1836   KETONESUR NEGATIVE 11/28/2019 1836   PROTEINUR 100 (A) 11/28/2019 1836   NITRITE NEGATIVE 11/28/2019 1836   LEUKOCYTESUR MODERATE (A) 11/28/2019 1836       Ordered   CXR -  NON acute  CTabd/pelvis - nonacute  CTA chest -old PE but not Acute   no evidence of infiltrate     ED Triage Vitals  Enc Vitals Group     BP 11/28/19 1835 (!) 77/53     Pulse Rate 11/28/19 1835 (!) 150     Resp 11/28/19 1835 (!) 28     Temp 11/28/19 1855 (!) 103.4 F (39.7 C)     Temp Source 11/28/19 1835 Rectal     SpO2 11/28/19 1835 100 %     Weight 11/28/19 1837 165 lb (74.8 kg)     Height  11/28/19 1837 _0  (1.676 m)     Head Circumference --      Peak Flow --      Pain Score 11/28/19 1837 10     Pain Loc --      Pain Edu? --      Excl. in [place]? --   TMAX(24)@       Latest  Blood pressure 99/81, pulse (!) 108, temperature 100.1 F (37.8 C), temperature source Oral, resp. rate 19, height _1  (1.676 m), weight 74.8 kg, SpO2 100 %.    Review of Systems:    Pertinent positives include:    Fevers, chills,   Constitutional:  No weight loss, night sweats,  fatigue, weight loss  HEENT:  No headaches, Difficulty swallowing,Tooth/dental problems,Sore throat,  No sneezing, itching, ear ache, nasal congestion, post nasal drip,  Cardio-vascular:  No chest pain, Orthopnea, PND, anasarca, dizziness, palpitations.no Bilateral lower extremity swelling  GI:  No heartburn, indigestion, abdominal pain, nausea, vomiting, diarrhea, change in bowel habits, loss of appetite, melena, blood in stool, hematemesis Resp:  no shortness of breath at rest. No dyspnea on exertion, No excess mucus, no productive cough, No non-productive cough, No coughing up of blood.No change in color of mucus.No wheezing. Skin:  no rash or lesions. No jaundice GU:  no dysuria, change in color of urine, no urgency or frequency. No straining to urinate.  No flank pain.  Musculoskeletal:  No joint pain or no joint swelling. No decreased range of motion. No back pain.  Psych:  No change in mood or affect. No depression or anxiety. No memory loss.  Neuro: no localizing neurological complaints, no tingling, no weakness, no double vision, no gait abnormality, no slurred speech, no confusion  All systems reviewed and apart from [place] all are negative  Past Medical History:   Past Medical History:  Diagnosis Date  . Epilepsy [facility])       Past Surgical History:  Procedure Laterality Date  . FRACTURE SURGERY      Social History:  Ambulatory  walker       reports that she has quit smoking. She has never used smokeless tobacco. She reports previous alcohol use. She reports previous drug use.   Family History:   Family History  Problem Relation Age of Onset  . Diabetes Father   . Diabetes Other     Allergies: No Known Allergies   Prior to Admission medications   Not on File  acetaminophen (TYLENOL) 325 MG tablet Take 2 tablets (650 mg total) by mouth every four (4) hours as needed. 0  . cholecalciferol, vitamin D3-25 mcg, 1,000 unit,, 25 mcg (1,000 unit) capsule Take 2  capsules (50 mcg total) by mouth at bedtime. 0  . cinacalcet (SENSIPAR) 30 MG tablet Take 1 tablet (30 mg total) by mouth two (2) times a day. 60 tablet 5  . hydrOXYzine (VISTARIL) 25 MG capsule Take 1 capsule (25 mg total) by mouth every eight (8) hours as needed for anxiety (agitation). 90 capsule 0  . magnesium oxide (MAG-OX) 400 mg (241.3 mg elemental magnesium) tablet Take 400 mg by mouth Two (2) times a day.  . multivitamins, therapeutic with minerals 9 mg iron-400 mcg tablet Take 1 tablet by mouth daily. 0  . PHENobarbitaL (LUMINAL) 64.8 MG tablet Take 1 tablet (64.8 mg total) by mouth Two (2) times a day. 60 tablet 3  . phenytoin extended (DILANTIN) 100 MG ER capsule Take 2 capsules (200 mg total) by mouth daily  AND 1 capsule (100 mg total) at bedtime. 90 capsule 3  . polyethylene glycol (MIRALAX) 17 gram packet Take 17 g by mouth daily as needed (constipation). 30 packet 0  . QUEtiapine (SEROQUEL) 25 MG tablet Take 1 tablet (25 mg total) by mouth nightly as needed (sleep or severe agitation not controlled by hydroxyzine). 30 tablet 0  . sertraline (ZOLOFT) 50 MG tablet Take 50 mg by mouth daily.  Marland Kitchen warfarin (JANTOVEN) 1 MG tablet Take 1 tablet (1 mg total) by mouth Monday, Wednesday, Thursday, Friday, and Sunday AND 1.5 tablets (1.5 mg total) 2 times a week (Tuesday and Saturday).    Physical Exam: Vitals with BMI 11/28/2019 11/28/2019 11/28/2019  Height - - -  Weight - - -  BMI - - -  Systolic 99 93 -  Diastolic 81 46 -  Pulse 086 110 107   1. General:  In Acute distress agitated    Chronically ill  -appearing 2. Psychological: Alert but not  Oriented 3. Head/ENT:     Dry Mucous Membranes                          Head Non traumatic, neck supple                            Poor Dentition 4. SKIN:  decreased Skin turgor,  Skin clean Dry and intact no rash 5. Heart: Regular rate and rhythm no  Murmur, no Rub or gallop 6. Lungs: Clear to auscultation bilaterally, no wheezes or  crackles   7. Abdomen: Soft,  non-tender,  distended   obese  bowel sounds present 8. Lower extremities: no clubbing, cyanosis,  No Edema  9. Neurologically Grossly intact, moving all 4 extremities equally   10. MSK: Normal range of motion   All other LABS:     Recent Labs  Lab 11/28/19 1836  WBC 8.2  NEUTROABS 7.7  HGB 11.6*  HCT 35.8*  MCV 105.6*  PLT 367     Recent Labs  Lab 11/28/19 1836  NA 137  K 4.7  CL 101  CO2 19*  GLUCOSE 94  BUN 24*  CREATININE 1.45*  CALCIUM 12.2*     Recent Labs  Lab 11/28/19 1836  AST 87*  ALT 43  ALKPHOS 145*  BILITOT 0.8  PROT 8.1  ALBUMIN 3.7       Cultures: No results found for: SDES, SPECREQUEST, CULT, REPTSTATUS   Radiological Exams on Admission: CT Angio Chest PE W and/or Wo Contrast  Result Date: 11/28/2019 CLINICAL DATA:  Fever, short of breath, abdominal pain EXAM: CT ANGIOGRAPHY CHEST CT ABDOMEN AND PELVIS WITH CONTRAST TECHNIQUE: Multidetector CT imaging of the chest was performed using the standard protocol during bolus administration of intravenous contrast. Multiplanar CT image reconstructions and MIPs were obtained to evaluate the vascular anatomy. Multidetector CT imaging of the abdomen and pelvis was performed using the standard protocol during bolus administration of intravenous contrast. CONTRAST:  22m OMNIPAQUE IOHEXOL 350 MG/ML SOLN COMPARISON:  01/03/2009, 11/28/2019 FINDINGS: CTA CHEST FINDINGS Cardiovascular: This is a technically adequate evaluation of the pulmonary vasculature. There are 2 small eccentric filling defects within the left lower lobe segmental pulmonary arteries, which have an appearance most consistent with sequela of chronic thromboembolic disease. This is best seen on image 132 of series 5 and image 141 of series 5. No other signs of additional filling defects or acute pulmonary emboli.  The heart is unremarkable without pericardial effusion. Mediastinum/Nodes: No enlarged mediastinal,  hilar, or axillary lymph nodes. Thyroid gland, trachea, and esophagus demonstrate no significant findings. Lungs/Pleura: No acute airspace disease, effusion, or pneumothorax. Scattered hypoventilatory changes versus scarring at the lung bases. Central airways are patent. Musculoskeletal: No acute displaced fracture. Sclerosis and invagination of the superior endplate of H85 consistent with chronic compression fracture. Reconstructed images demonstrate no additional findings. Review of the MIP images confirms the above findings. CT ABDOMEN and PELVIS FINDINGS Hepatobiliary: No focal liver abnormality is seen. Noncalcified gallstones seen within the gallbladder neck, best visualized on delayed images. No gallbladder wall thickening or signs of acute cholecystitis. Pancreas: Unremarkable. No pancreatic ductal dilatation or surrounding inflammatory changes. Spleen: Normal in size without focal abnormality. Adrenals/Urinary Tract: Small left renal cortical cysts are noted. Mild bilateral renal cortical atrophy. Punctate calcifications are seen at the renal hila, likely vascular. No definite urinary tract calculi or obstructive uropathy. The adrenals and bladder are unremarkable. Stomach/Bowel: No bowel obstruction or ileus. Normal appendix right lower quadrant. No bowel wall thickening or inflammatory change. Vascular/Lymphatic: Aortic atherosclerosis. No enlarged abdominal or pelvic lymph nodes. Reproductive: Uterus and bilateral adnexa are unremarkable. Other: No free fluid or free gas. No abdominal wall hernia. Musculoskeletal: Chronic appearing compression deformities are seen at T11 and L1. There are no acute displaced fractures. Bilateral hip arthroplasties are unremarkable. Reconstructed images demonstrate no additional findings. Review of the MIP images confirms the above findings. IMPRESSION: 1. Small eccentric filling defects within the left lower lobe segmental pulmonary arteries, which have an appearance most  consistent with sequela of chronic thromboembolic disease. No signs of acute pulmonary emboli. 2. Noncalcified gallstone.  No evidence of acute cholecystitis. 3. Aortic Atherosclerosis (ICD10-I70.0). Electronically Signed   By: Randa Ngo M.D.   On: 11/28/2019 20:21   CT ABDOMEN PELVIS W CONTRAST  Result Date: 11/28/2019 CLINICAL DATA:  Fever, short of breath, abdominal pain EXAM: CT ANGIOGRAPHY CHEST CT ABDOMEN AND PELVIS WITH CONTRAST TECHNIQUE: Multidetector CT imaging of the chest was performed using the standard protocol during bolus administration of intravenous contrast. Multiplanar CT image reconstructions and MIPs were obtained to evaluate the vascular anatomy. Multidetector CT imaging of the abdomen and pelvis was performed using the standard protocol during bolus administration of intravenous contrast. CONTRAST:  41m OMNIPAQUE IOHEXOL 350 MG/ML SOLN COMPARISON:  01/03/2009, 11/28/2019 FINDINGS: CTA CHEST FINDINGS Cardiovascular: This is a technically adequate evaluation of the pulmonary vasculature. There are 2 small eccentric filling defects within the left lower lobe segmental pulmonary arteries, which have an appearance most consistent with sequela of chronic thromboembolic disease. This is best seen on image 132 of series 5 and image 141 of series 5. No other signs of additional filling defects or acute pulmonary emboli. The heart is unremarkable without pericardial effusion. Mediastinum/Nodes: No enlarged mediastinal, hilar, or axillary lymph nodes. Thyroid gland, trachea, and esophagus demonstrate no significant findings. Lungs/Pleura: No acute airspace disease, effusion, or pneumothorax. Scattered hypoventilatory changes versus scarring at the lung bases. Central airways are patent. Musculoskeletal: No acute displaced fracture. Sclerosis and invagination of the superior endplate of TI77consistent with chronic compression fracture. Reconstructed images demonstrate no additional findings.  Review of the MIP images confirms the above findings. CT ABDOMEN and PELVIS FINDINGS Hepatobiliary: No focal liver abnormality is seen. Noncalcified gallstones seen within the gallbladder neck, best visualized on delayed images. No gallbladder wall thickening or signs of acute cholecystitis. Pancreas: Unremarkable. No pancreatic ductal dilatation or surrounding inflammatory changes. Spleen:  Normal in size without focal abnormality. Adrenals/Urinary Tract: Small left renal cortical cysts are noted. Mild bilateral renal cortical atrophy. Punctate calcifications are seen at the renal hila, likely vascular. No definite urinary tract calculi or obstructive uropathy. The adrenals and bladder are unremarkable. Stomach/Bowel: No bowel obstruction or ileus. Normal appendix right lower quadrant. No bowel wall thickening or inflammatory change. Vascular/Lymphatic: Aortic atherosclerosis. No enlarged abdominal or pelvic lymph nodes. Reproductive: Uterus and bilateral adnexa are unremarkable. Other: No free fluid or free gas. No abdominal wall hernia. Musculoskeletal: Chronic appearing compression deformities are seen at T11 and L1. There are no acute displaced fractures. Bilateral hip arthroplasties are unremarkable. Reconstructed images demonstrate no additional findings. Review of the MIP images confirms the above findings. IMPRESSION: 1. Small eccentric filling defects within the left lower lobe segmental pulmonary arteries, which have an appearance most consistent with sequela of chronic thromboembolic disease. No signs of acute pulmonary emboli. 2. Noncalcified gallstone.  No evidence of acute cholecystitis. 3. Aortic Atherosclerosis (ICD10-I70.0). Electronically Signed   By: Randa Ngo M.D.   On: 11/28/2019 20:21   DG Chest Port 1 View  Result Date: 11/28/2019 CLINICAL DATA:  Questionable sepsis.  Evaluate for pneumonia. EXAM: PORTABLE CHEST 1 VIEW COMPARISON:  03/30/2017 FINDINGS: The stable cardiomediastinal  contours. No pleural effusion identified. No airspace consolidation identified to suggest pneumonia. Healing left distal clavicle fracture. IMPRESSION: No active disease. Electronically Signed   By: Kerby Moors M.D.   On: 11/28/2019 19:31    Chart has been reviewed  Assessment/Plan  74 y.o. 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 Severe sepsis ([place]) -  -SIRS criteria met with      tachycardia   ,    fever  (at facility) RR >20 Today's Vitals   11/28/19 2130 11/28/19 2136 11/28/19 2145 11/28/19 2148  BP: (!) 117/98  (!) 135/125   Pulse: (!) 109  (!) 114   Resp: (!) 29  (!) 21   Temp:  100.1 F (37.8 C)    TempSrc:  Oral    SpO2: 100%  99%   Weight:      Height:      PainSc:    5       -Most likely source being  urinary,     Patient meeting criteria for Severe sepsis with    evidence of end organ damage/organ dysfunction such as   elevated lactic acid >2     Component Value Date/Time   LATICACIDVEN 3.8 ([place]) 11/28/2019 2054   Lactic Acid, Venous    Component Value Date/Time   LATICACIDVEN 5.1 ([place]) 11/28/2019 5784    acute metabolic encephalopathy  ONG<29 mmhg or MAP < 65 mmhg,     Patient is meeting criteria for SEPTIC SHOCK with  lactic acid > 4 or multiple SBP readings below 90 mmhg or MAP reading below 65 mmhg   - Obtain serial lactic acid and procalcitonin level.  - Initiated IV antibiotics cefepime and vancomycin  - await results of blood and urine culture  - Rehydrate aggressively        30cc/kg fluid    10:42 PM Blood pressure appears to be improving Although continues to do have significant lactic acidosis Continue to monitor carefully and rehydrate continue IV fluids. Per family okay to start pressors if needed right now no indication for  pressors 1:01  AM  Acute metabolic Encephalopathy -   - most likely multifactorial secondary to combination of  infection  dehydration secondary to decreased by mouth intake,  polypharmacy hypercalcemia  - Will rehydrate   - treat underlining infection   - Hold contributing medications   - if no improvement may need further imaging to evaluate for CNS pathology pathology such as MRI of the brain   - neurological exam appears to be nonfocal but patient unable to cooperate fully   - VBG unremarkable no evidence of hypercarbia    - no history of liver disease   . Hypercalcemia -recurrent in the past resolved with IV fluid resuscitation patient has hyperparathyroidism resulting in recurrent hypercalcemia Patient is on Sensipar will resume if able to tolerate p.o. For now patient unable to tolerate p.o. we will give a dose of calcitonin Check vitamin D level Check PTH Continue to follow serial calcium levels Would not be good candidate for loop diuretics given hypotension  . AKI (acute kidney injury) ([place]) -secondary to dehydration will rehydrate and follow  Mild LFT elevation - check CK level, no evidence of rhabdo   Most likely secondary to hypotension  . Acute lower UTI -  - treat with cefepime given severity of sepsis       await results of urine culture and adjust antibiotic coverage as needed  . Dementia with behavioral disturbance ([place]) resume home meds when stable Monitor for any signs of sundowning  . Prolonged QT interval -- will monitor on tele avoid QT prolonging medications, rehydrate correct electrolytes  . Acute respiratory failure with hypoxia (HCC) -transient CT chest showed no evidence of PE or infiltrate.  Perhaps was evident due to hypoperfusion      History of recent DVT -patient on Coumadin but subtherapeutic transition to heparin Until therapeutic CTA showing old PE but nonacute    History of seizure disorder -currently unable to tolerate p.o. we will transition to IV  phenobarbital and phenytoin appreciate pharmacy help with dosage  Hypomagnesemia -will replace  Hypophosphatemia severe we will replace and follow monitor on telemetry  Other plan as per orders.  DVT prophylaxis:  heparin       Code Status:    Code Status: Not on file Limited code ok with pressors, BiPAP, Shocking is ok as per family no CPR no intubation I had personally discussed CODE STATUS with   family      Family Communication:   Family not at  Bedside  plan of care was discussed on the phone with  Daughter who is POA  Disposition Plan:                              Back to current facility when stable                                Following barriers for discharge:                            Electrolytes corrected  Pain controlled with PO medications                               Afebrile, white count improving able to transition to PO antibiotics                             Will need to be able to tolerate PO                                                Would benefit from PT/OT eval prior to DC  Ordered                   Swallow eval - SLP ordered                                     Transition of care consulted                   Nutrition    consulted                                   Consults called: none    Admission status:  ED Disposition    ED Disposition Condition [place]: [facility] [[id number]]  Level of Care: Progressive Cardiac [106]  Admit to Progressive based on following criteria: MULTISYSTEM THREATS such as stable sepsis, metabolic/electrolyte imbalance with or without encephalopathy that is responding to early treatment.  Covid Evaluation: Symptomatic Person Under Investigation (PUI)  Diagnosis: Sepsis ([place]) [[id number]]  Admitting Physician: Toy Baker [[id number]]  Attending Physician: Toy Baker [[id number]]  Estimated length of stay: 3 - 4  days  Certification:: I certify this patient will need inpatient services for at least 2 midnights         inpatient     I Expect 2 midnight stay secondary to severity of patient's current illness need for inpatient interventions justified by the following:  hemodynamic instability despite optimal treatment (tachycardia  hypotension   tachypnea  hypoxia,  )  Severe lab/radiological/exam abnormalities including:   AKI hypercalcemia and extensive comorbidities including:  Chronic pain .  dementia Chronic anticoagulation  That are currently affecting medical management.   I expect  patient to be hospitalized for 2 midnights requiring inpatient medical care.  Patient is at high risk for adverse outcome (such as loss of life or disability) if not treated.  Indication for inpatient stay as follows:  Severe change from baseline regarding mental status Hemodynamic instability despite maximal medical therapy,    severe pain requiring acute inpatient management,  inability to maintain oral hydration      New or worsening hypoxia  Need for IV antibiotics, IV fluids   Level of care  Progressive tele indefinitely please discontinue once patient no longer qualifies COVID-19 Labs    Lab Results  Component Value Date   [place] NEGATIVE 11/28/2019     Precautions: admitted as   Covid Negative     PPE: Used by the provider:   P100  eye Goggles,  Gloves  Jossie Smoot 11/28/2019, 10:46 PM     [facility]     after 2 AM please page floor coverage PA If 7AM-7PM, please contact the day team taking care of the patient using [email or web address]   Patient was evaluated in the context of the global COVID-19 pandemic, which necessitated consideration that the patient might be at risk for infection with the SARS-CoV-2 virus that causes COVID-19. Institutional protocols and algorithms that pertain to the evaluation of patients at risk for COVID-19 are in a state of rapid  change based on information released by regulatory bodies including the CDC and federal and state organizations. These policies and algorithms were followed during the patient's care.

## 2019-11-28 NOTE — ED Notes (Signed)
To ct scan

## 2019-11-28 NOTE — Progress Notes (Signed)
PHARMACY -  BRIEF ANTIBIOTIC NOTE   Pharmacy has received consult(s) for vancomycin and cefepime from an ED provider.  The patient's profile has been reviewed for ht/wt/allergies/indication/available labs.    One time order(s) placed for: Vancomycin 1000mg   Cefepime 2g   Further antibiotics/pharmacy consults should be ordered by admitting physician if indicated.                       , PharmD Pharmacy Resident  11/28/2019 6:39 PM

## 2019-11-28 NOTE — ED Notes (Signed)
Pt [facility] with a fever and sob.  Pt on nonrebreather on arrival  Iv's placed and fluids and meds given.  Pt crying out.  Pt had large bm on arrival to treatment room.  Pt cleaned.   Cath ua obtained.  ekg done.  Sinus tach on monitor.

## 2019-11-29 DIAGNOSIS — L8990 Pressure ulcer of unspecified site, unspecified stage: Secondary | ICD-10-CM | POA: Insufficient documentation

## 2019-11-29 LAB — HEPARIN LEVEL (UNFRACTIONATED)
Heparin Unfractionated: 0.54 IU/mL (ref 0.30–0.70)
Heparin Unfractionated: 0.76 IU/mL — ABNORMAL HIGH (ref 0.30–0.70)

## 2019-11-29 LAB — COMPREHENSIVE METABOLIC PANEL
ALT: 30 U/L (ref 0–44)
AST: 53 U/L — ABNORMAL HIGH (ref 15–41)
Albumin: 2.7 g/dL — ABNORMAL LOW (ref 3.5–5.0)
Alkaline Phosphatase: 104 U/L (ref 38–126)
Anion gap: 9 (ref 5–15)
BUN: 22 mg/dL (ref 8–23)
CO2: 20 mmol/L — ABNORMAL LOW (ref 22–32)
Calcium: 10.4 mg/dL — ABNORMAL HIGH (ref 8.9–10.3)
Chloride: 106 mmol/L (ref 98–111)
Creatinine, Ser: 1.49 mg/dL — ABNORMAL HIGH (ref 0.44–1.00)
GFR, Estimated: 37 mL/min — ABNORMAL LOW (ref >60)
Glucose, Bld: 149 mg/dL — ABNORMAL HIGH (ref 70–99)
Potassium: 3.6 mmol/L (ref 3.5–5.1)
Sodium: 135 mmol/L (ref 135–145)
Total Bilirubin: 1.1 mg/dL (ref 0.3–1.2)
Total Protein: 6.3 g/dL — ABNORMAL LOW (ref 6.5–8.1)

## 2019-11-29 LAB — CBC WITH DIFFERENTIAL/PLATELET
Abs Immature Granulocytes: 0.07 10*3/uL (ref 0.00–0.07)
Basophils Absolute: 0 10*3/uL (ref 0.0–0.1)
Basophils Relative: 0 %
Eosinophils Absolute: 0 10*3/uL (ref 0.0–0.5)
Eosinophils Relative: 0 %
HCT: 25.2 % — ABNORMAL LOW (ref 36.0–46.0)
Hemoglobin: 8.7 g/dL — ABNORMAL LOW (ref 12.0–15.0)
Immature Granulocytes: 1 %
Lymphocytes Relative: 2 %
Lymphs Abs: 0.3 10*3/uL — ABNORMAL LOW (ref 0.7–4.0)
MCH: 34.7 pg — ABNORMAL HIGH (ref 26.0–34.0)
MCHC: 34.5 g/dL (ref 30.0–36.0)
MCV: 100.4 fL — ABNORMAL HIGH (ref 80.0–100.0)
Monocytes Absolute: 0.7 10*3/uL (ref 0.1–1.0)
Monocytes Relative: 6 %
Neutro Abs: 11.9 10*3/uL — ABNORMAL HIGH (ref 1.7–7.7)
Neutrophils Relative %: 91 %
Platelets: 242 10*3/uL (ref 150–400)
RBC: 2.51 MIL/uL — ABNORMAL LOW (ref 3.87–5.11)
RDW: 12.9 % (ref 11.5–15.5)
WBC: 13 10*3/uL — ABNORMAL HIGH (ref 4.0–10.5)
nRBC: 0 % (ref 0.0–0.2)

## 2019-11-29 LAB — RESPIRATORY PANEL BY RT PCR (FLU A&B, COVID)
Influenza A by PCR: NEGATIVE
Influenza B by PCR: NEGATIVE
SARS Coronavirus 2 by RT PCR: NEGATIVE

## 2019-11-29 LAB — CALCIUM
Calcium: 11.1 mg/dL — ABNORMAL HIGH (ref 8.9–10.3)
Calcium: 10 mg/dL (ref 8.9–10.3)
Calcium: 9.1 mg/dL (ref 8.9–10.3)

## 2019-11-29 LAB — LACTIC ACID, PLASMA
Lactic Acid, Venous: 5.1 mmol/L (ref 0.5–1.9)
Lactic Acid, Venous: 2.4 mmol/L (ref 0.5–1.9)
Lactic Acid, Venous: 1 mmol/L (ref 0.5–1.9)
Lactic Acid, Venous: 1.5 mmol/L (ref 0.5–1.9)

## 2019-11-29 LAB — MRSA PCR SCREENING: MRSA by PCR: POSITIVE — AB

## 2019-11-29 LAB — CK: Total CK: 79 U/L (ref 38–234)

## 2019-11-29 LAB — MAGNESIUM
Magnesium: 1.4 mg/dL — ABNORMAL LOW (ref 1.7–2.4)
Magnesium: 2.1 mg/dL (ref 1.7–2.4)

## 2019-11-29 LAB — CREATININE, URINE, RANDOM: Creatinine, Urine: 73 mg/dL

## 2019-11-29 LAB — PREALBUMIN: Prealbumin: 15.9 mg/dL — ABNORMAL LOW (ref 18–38)

## 2019-11-29 LAB — PROCALCITONIN
Procalcitonin: 26.53 ng/mL
Procalcitonin: 31.92 ng/mL

## 2019-11-29 LAB — TSH: TSH: 0.898 u[IU]/mL (ref 0.350–4.500)

## 2019-11-29 LAB — PHENOBARBITAL LEVEL: Phenobarbital: 19.5 ug/mL (ref 15.0–30.0)

## 2019-11-29 LAB — BLOOD GAS, VENOUS

## 2019-11-29 LAB — GLUCOSE, CAPILLARY: Glucose-Capillary: 99 mg/dL (ref 70–99)

## 2019-11-29 LAB — PHOSPHORUS
Phosphorus: <1 mg/dL — CL (ref 2.5–4.6)
Phosphorus: 1.2 mg/dL — ABNORMAL LOW (ref 2.5–4.6)

## 2019-11-29 LAB — SODIUM, URINE, RANDOM: Sodium, Ur: 40 mmol/L

## 2019-11-29 MED ORDER — CHLORHEXIDINE GLUCONATE CLOTH 2 % EX PADS
6.0000 | MEDICATED_PAD | Freq: Every day | CUTANEOUS | Status: DC
  Administered 2019-11-30: 6 via TOPICAL

## 2019-11-29 MED ORDER — CALCITONIN (SALMON) 200 UNIT/ML IJ SOLN
4.0000 [IU]/kg | Freq: Two times a day (BID) | INTRAMUSCULAR | Status: DC
  Administered 2019-11-29 (×2): 300 [IU] via SUBCUTANEOUS
  Filled 2019-11-29 (×6): qty 1.5

## 2019-11-29 MED ORDER — PHENYTOIN SODIUM 50 MG/ML IJ SOLN
100.0000 mg | Freq: Two times a day (BID) | INTRAMUSCULAR | Status: DC
  Administered 2019-11-29 – 2019-11-30 (×3): 100 mg via INTRAVENOUS
  Filled 2019-11-29 (×5): qty 2

## 2019-11-29 MED ORDER — POTASSIUM PHOSPHATES 15 MMOLE/5ML IV SOLN
20.0000 mmol | Freq: Once | INTRAVENOUS | Status: AC
  Administered 2019-11-29: 20 mmol via INTRAVENOUS
  Filled 2019-11-29: qty 6.67

## 2019-11-29 MED ORDER — ENSURE ENLIVE PO LIQD
237.0000 mL | Freq: Two times a day (BID) | ORAL | Status: DC
  Administered 2019-12-01: 237 mL via ORAL

## 2019-11-29 MED ORDER — SODIUM CHLORIDE 0.9 % IV SOLN
2.0000 g | Freq: Two times a day (BID) | INTRAVENOUS | Status: DC
  Administered 2019-11-29 – 2019-11-30 (×3): 2 g via INTRAVENOUS
  Filled 2019-11-29 (×5): qty 2

## 2019-11-29 MED ORDER — SODIUM CHLORIDE 0.9 % IV BOLUS
1000.0000 mL | Freq: Once | INTRAVENOUS | Status: AC
  Administered 2019-11-29: 1000 mL via INTRAVENOUS

## 2019-11-29 MED ORDER — ADULT MULTIVITAMIN W/MINERALS CH
1.0000 | ORAL_TABLET | Freq: Every day | ORAL | Status: DC
  Administered 2019-11-30 – 2019-12-03 (×4): 1 via ORAL
  Filled 2019-11-29 (×4): qty 1

## 2019-11-29 MED ORDER — LORAZEPAM 2 MG/ML IJ SOLN
INTRAMUSCULAR | Status: AC
  Filled 2019-11-29: qty 1

## 2019-11-29 MED ORDER — SODIUM CHLORIDE 0.9 % IV SOLN: 250.0000 mL | INTRAVENOUS | Status: DC

## 2019-11-29 MED ORDER — SALINE SPRAY 0.65 % NA SOLN
1.0000 | NASAL | Status: DC | PRN
  Filled 2019-11-29: qty 44

## 2019-11-29 MED ORDER — SODIUM CHLORIDE 0.9 % IV SOLN: INTRAVENOUS | Status: DC

## 2019-11-29 MED ORDER — MAGNESIUM SULFATE 2 GM/50ML IV SOLN
2.0000 g | Freq: Once | INTRAVENOUS | Status: AC
  Administered 2019-11-29: 2 g via INTRAVENOUS
  Filled 2019-11-29: qty 50

## 2019-11-29 MED ORDER — PHENOBARBITAL SODIUM 130 MG/ML IJ SOLN
60.0000 mg | Freq: Two times a day (BID) | INTRAMUSCULAR | Status: DC
  Administered 2019-11-29 – 2019-11-30 (×3): 60 mg via INTRAVENOUS
  Filled 2019-11-29 (×2): qty 1
  Filled 2019-11-29: qty 0.46

## 2019-11-29 MED ORDER — NOREPINEPHRINE 4 MG/250ML-% IV SOLN
2.0000 ug/min | INTRAVENOUS | Status: DC
  Administered 2019-11-29: 5 ug/min via INTRAVENOUS
  Filled 2019-11-29 (×2): qty 250

## 2019-11-29 MED ORDER — ORAL CARE MOUTH RINSE
15.0000 mL | Freq: Two times a day (BID) | OROMUCOSAL | Status: DC
  Administered 2019-11-30 – 2019-12-03 (×7): 15 mL via OROMUCOSAL

## 2019-11-29 MED ORDER — MUPIROCIN 2 % EX OINT
1.0000 "application " | TOPICAL_OINTMENT | Freq: Two times a day (BID) | CUTANEOUS | Status: DC
  Administered 2019-11-29 – 2019-12-03 (×9): 1 via NASAL
  Filled 2019-11-29: qty 22

## 2019-11-29 MED ORDER — VANCOMYCIN HCL IN DEXTROSE 1-5 GM/200ML-% IV SOLN
1000.0000 mg | INTRAVENOUS | Status: DC
  Administered 2019-11-29 – 2019-11-30 (×2): 1000 mg via INTRAVENOUS
  Filled 2019-11-29 (×3): qty 200

## 2019-11-29 MED ORDER — LORAZEPAM 2 MG/ML IJ SOLN
0.5000 mg | Freq: Once | INTRAMUSCULAR | Status: AC
  Administered 2019-11-29: 0.5 mg via INTRAVENOUS

## 2019-11-29 NOTE — Evaluation (Signed)
Clinical/Bedside Swallow Evaluation Patient Details  Name: Shannon Kelley MRN: [id number] Date of Birth: 1945/12/22  Today's Date: 11/29/2019 Time: SLP Start Time (ACUTE ONLY): 0835 SLP Stop Time (ACUTE ONLY): 0855 SLP Time Calculation (min) (ACUTE ONLY): 20 min  Past Medical History:  Past Medical History:  Diagnosis Date  . Epilepsy [facility])    Past Surgical History:  Past Surgical History:  Procedure Laterality Date  . FRACTURE SURGERY     HPI:  Shannon Kelley is a 74 y.o. 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 Chest x-ray clear.    Assessment / Plan / Recommendation Clinical Impression  Pt presents with adequate oropharyngeal abilities when consuming regular diet textures with thin liquids via straw. Pt's emotional lability fluctuated throughout evaluation but she was responsive to gentle reminders to talk instead of moaning. when cued, pt was able to express her wants and needs. St this time, recommend rsuming a regular diet with thinliquids and medicine whole with general aspiration precautions. ST intervention is not indicated at this time.  SLP Visit Diagnosis: Dysphagia, unspecified (R13.10)    Aspiration Risk  Mild aspiration risk    Diet Recommendation Regular;Thin liquid   Liquid Administration via: Straw Medication Administration: Whole meds with liquid Supervision: Patient able to self feed Compensations: Minimize environmental distractions;Slow rate;Small sips/bites Postural Changes: Seated upright at 90 degrees    Other  Recommendations   N/a  Follow up Recommendations   N/A     Frequency and Duration   N/A         Prognosis   N/A     Swallow Study   General Date of Onset: 11/29/19 HPI: Shannon Kelley is a 74 y.o. 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 Chest x-ray clear.  Type of Study: Bedside Swallow Evaluation Previous Swallow Assessment: none in chart Diet Prior to this Study: NPO Temperature Spikes Noted: No Respiratory Status: Room air History of Recent Intubation: No Behavior/Cognition: Alert;Cooperative;Pleasant mood;Agitated;Confused Oral Cavity Assessment: Within Functional Limits Oral Care Completed by SLP: No Oral Cavity - Dentition: Adequate natural dentition Vision: Functional for self-feeding Self-Feeding Abilities: Able to feed self Patient Positioning: Upright in bed Baseline Vocal Quality: Normal Volitional Cough: Cognitively unable to elicit Volitional Swallow: Unable to elicit    Oral/Motor/Sensory Function Overall Oral Motor/Sensory Function: Within functional limits   Ice Chips Ice chips: Not tested   Thin Liquid Thin Liquid: Within functional limits Presentation: Self Fed;Straw;Cup    Nectar Thick Nectar Thick Liquid: Not tested   Honey Thick Honey Thick Liquid: Not tested   Puree Puree: Within functional limits   Solid     Solid: Within functional limits Presentation: Self Fed     Vandana Haman B. Dreama Saa, M.S., CCC-SLP, [facility] Speech-Language Pathologist Rehabilitation Services Office [phone]  Khiry Pasquariello 11/29/2019,9:58 AM

## 2019-11-29 NOTE — ED Notes (Addendum)
Contacted MD regarding pt BP  Pt placed trendelenburg

## 2019-11-29 NOTE — Progress Notes (Signed)
Patient admitted to unit. Attempted to orient to room, call bell, and staff. Bed in lowest position. Fall safety plan reviewed. Full assessment to Epic. Skin assessment verified with Mechele Dawley. Telemetry box verification with tele clerk. Will continue to monitor.

## 2019-11-29 NOTE — Progress Notes (Signed)
ANTICOAGULATION CONSULT NOTE - Consult  Pharmacy Consult for Heparin Indication: VTE treatment  No Known Allergies  Patient Measurements: Height: 5\' 6"  (167.6 cm) Weight: 74.8 kg (165 lb) IBW/kg (Calculated) : 59.3 HEPARIN DW (KG): 74.3  Vital Signs: Temp: 98.4 F (36.9 C) (11/11 1602) Temp Source: Axillary (11/11 1521) BP: 83/43 (11/11 1602) Pulse Rate: 96 (11/11 1602)  Labs: Recent Labs    11/28/19 1836 11/28/19 2322 11/29/19 0320 11/29/19 0810 11/29/19 1546  HGB 11.6*  --   --  8.7*  --   HCT 35.8*  --   --  25.2*  --   PLT 367  --   --  242  --   APTT <24*  --   --   --   --   LABPROT 16.7*  --   --   --   --   INR 1.4*  --   --   --   --   HEPARINUNFRC  --   --   --  0.54 0.76*  CREATININE 1.45*  --  1.49*  --   --   CKTOTAL  --  79  --   --   --    Estimated Creatinine Clearance: 34.3 mL/min (A) (by C-G formula based on SCr of 1.49 mg/dL (H)).  Medical History: Past Medical History:  Diagnosis Date  . Epilepsy (HCC)     Medications:  Medications Prior to Admission  Medication Sig Dispense Refill Last Dose  . acetaminophen (TYLENOL) 325 MG tablet Take 2 tablets by mouth every 4 (four) hours as needed.   PRN at PRN  . Cholecalciferol 25 MCG (1000 UT) capsule Take 2 capsules by mouth at bedtime.   11/27/2019 at 2000  . cinacalcet (SENSIPAR) 30 MG tablet Take 30 mg by mouth 2 (two) times daily with a meal.   11/27/2019 at 1800  . lidocaine (LIDODERM) 5 % Place 2 patches onto the skin daily. Remove & Discard patch within 12 hours or as directed by MD (Apply to lower back every morning)   11/28/2019 at 0900  . magnesium oxide (MAG-OX) 400 MG tablet Take 400 mg by mouth in the morning, at noon, and at bedtime.    11/28/2019 at 1200  . midodrine (PROAMATINE) 10 MG tablet Take 10 mg by mouth 3 (three) times daily.   11/28/2019 at 1130  . Multiple Vitamins-Minerals (THERA-M) TABS Take 1 tablet by mouth daily.   11/28/2019 at 0900  . PHENobarbital (LUMINAL) 64.8 MG  tablet Take 64.8 mg by mouth 2 (two) times daily.   11/28/2019 at 0900  . phenytoin (DILANTIN) 100 MG ER capsule Take 100-200 mg by mouth 2 (two) times daily. Take 2 capsules (200mg ) q day and 1 capsule (100mg ) qhs   11/28/2019 at 0900  . polyethylene glycol (MIRALAX / GLYCOLAX) 17 g packet Take 17 g by mouth daily as needed.   prn at prn  . warfarin (COUMADIN) 5 MG tablet Take 7 mg by mouth daily.    11/27/2019 at 1800  . hydrOXYzine (ATARAX/VISTARIL) 25 MG tablet Take 25 mg by mouth every 8 (eight) hours as needed for anxiety. (Patient not taking: Reported on 11/29/2019)   Not Taking at Unknown time  . hydrOXYzine (VISTARIL) 25 MG capsule Take 1 capsule by mouth every 8 (eight) hours as needed for anxiety. (Patient not taking: Reported on 11/29/2019)   Not Taking at Unknown time  . QUEtiapine (SEROQUEL) 25 MG tablet Take 25 mg by mouth at bedtime.  (Patient not  taking: Reported on 11/29/2019)   Not Taking at Unknown  . sertraline (ZOLOFT) 50 MG tablet Take 50 mg by mouth daily. (Patient not taking: Reported on 11/29/2019)   Not Taking at Unknown time    Assessment: H/H downtrended, PLTs 395>320.  Initiating Heparin for VTE treatment.  Pt transitioned from subtherapeutic warfarin to Heparin infusion while inpatient for treatment of pre-existing non-acute PE visualized on imaging. Heparin level was therapeutic x1 at 0.54 on 11/11 0810.   Baseline INR = 1.4, aPTT = < 24. Date TIME HL Dosing Rate 11/11 0810 0.54 1200 units/hr 11/11 1546 0.76  Goal of Therapy:  Heparin level 0.3-0.7 units/ml Monitor platelets by anticoagulation protocol: Yes   Plan:  11/11 @ 1546 HL 0.76. Level is supratherapeutic. Will reduce  infusion rate to 1100 units/hr Recheck HL in 8 hours  Continue to monitor CBC daily per protocol  Gardner Candle, PharmD, BCPS Clinical Pharmacist 11/29/2019 5:09 PM

## 2019-11-29 NOTE — ED Notes (Addendum)
Pt cleansed of urine and given bed changed with new blankets. PT able to stop yelling and  communicate during changing that she would need help rolling.

## 2019-11-29 NOTE — Progress Notes (Signed)
Patient transferring to CCU 19. Daughter was previously made aware of transfer. Report called to [employer]

## 2019-11-29 NOTE — Progress Notes (Signed)
Initial Nutrition Assessment  DOCUMENTATION CODES:   Not applicable  INTERVENTION:   Ensure Enlive po BID, each supplement provides 350 kcal and 20 grams of protein  Magic cup TID with meals, each supplement provides 290 kcal and 9 grams of protein  MVI daily  Dysphagia 3 diet   Pt at high refeed risk; recommend monitor potassium, magnesium and phosphorus labs daily until stable  NUTRITION DIAGNOSIS:   Inadequate oral intake related to acute illness as evidenced by meal completion < 25%.  GOAL:   Patient will meet greater than or equal to 90% of their needs  MONITOR:   PO intake, Supplement acceptance, Labs, Weight trends, Skin, I & O's  REASON FOR ASSESSMENT:   Consult Assessment of nutrition requirement/status  ASSESSMENT:   74 y.o. 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   Visited pt's room today. Pt unable to provide any nutrition related history r/t dementia. Per family report, pt with poor appetite and oral intake pta. Family also reports pt with weight loss over the past year. Pt currently with poor appetite and oral intake in hospital. RD will add supplements and MVI to help pt meet her estimated needs. RD will also change pt to a mechanical soft diet. Pt is at refeed risk. There is not a very detailed weight history in chart; pt's last documented weight was 205lbs in July. It is difficult to determine is pt has had any recent significant weight loss based on just two documented weights.    Medications reviewed and include: calcitonin, cinacalcet, NaCl @150ml /hr, heparin, cefepime, vancomycin   Labs reviewed: creat 1.49(H), Ca 10.0 wnl adj. 11.04(H), P 1.2(L), Mg 2.1 wnl, pre-albumin 15.9(L)  wbc- 13.0(H), Hgb 8.7(L), Hct 25.2(L), MCV  100.4(H), MCH 34.7(H)  NUTRITION - FOCUSED PHYSICAL EXAM:    Most Recent Value  Orbital Region No depletion  Upper Arm Region Mild depletion  Thoracic and Lumbar Region No depletion  Buccal Region No depletion  Temple Region Mild depletion  Clavicle Bone Region No depletion  Clavicle and Acromion Bone Region No depletion  Scapular Bone Region No depletion  Dorsal Hand Mild depletion  Patellar Region Mild depletion  Anterior Thigh Region Mild depletion  Posterior Calf Region No depletion  Edema (RD Assessment) Mild  Hair Reviewed  Eyes Reviewed  Mouth Reviewed  Skin Reviewed  Nails Reviewed     Diet Order:   Diet Order            Diet regular Room service appropriate? Yes; Fluid consistency: Thin  Diet effective now                EDUCATION NEEDS:   No education needs have been identified at this time  Skin:  Skin Assessment: Reviewed RN Assessment (Stage I sacrum, ecchymosis)  Last BM:  PTA  Height:   Ht Readings from Last 1 Encounters:  11/28/19 5\' 6"  (1.676 m)    Weight:   Wt Readings from Last 1 Encounters:  11/28/19 74.8 kg    Ideal Body Weight:  59 kg  BMI:  Body mass index is 26.63 kg/m.  Estimated Nutritional Needs:   Kcal:  1600-1800kcal/day  Protein:  80-90g/day  Fluid:  1.5-1.8L/day  MS, RD, LDN Please refer to [facility] for RD and/or RD on-call/weekend/after hours pager

## 2019-11-29 NOTE — Progress Notes (Signed)
Pharmacy Antibiotic Note  Shannon Kelley is a 74 y.o. female admitted on 11/28/2019 with sepsis.  Pharmacy has been consulted for Vancomycin and Cefepime dosing.  Plan: Vancomycin 1gm IV q24hrs (per nomogram), pt did not receive full loading dose so will give next dose early Cefepime 2gm IV q12hrs  Height: 5\' 6"  (167.6 cm) Weight: 74.8 kg (165 lb) IBW/kg (Calculated) : 59.3  Temp (24hrs), Avg:101.6 F (38.7 C), Min:100.1 F (37.8 C), Max:103.4 F (39.7 C)  Recent Labs  Lab 11/28/19 1836 11/28/19 2054 11/28/19 2322  WBC 8.2  --   --   CREATININE 1.45*  --   --   LATICACIDVEN 5.7* 3.8* 5.1*    Estimated Creatinine Clearance: 35.2 mL/min (A) (by C-G formula based on SCr of 1.45 mg/dL (H)).    Allergies  Allergen Reactions  . Lorazepam Other (See Comments)    Pt had ARDS with admission to ER.  Had ativan which caused her bp to drop, which was more likely due to being given in setting of ARDS    Antimicrobials this admission:   >>    >>   Dose adjustments this admission:   Microbiology results:  BCx:   UCx:    Sputum:    MRSA PCR:   Thank you for allowing pharmacy to be a part of this patient's care.  13/10/21 A 11/29/2019 2:27 AM

## 2019-11-29 NOTE — Progress Notes (Signed)
OT Cancellation Note  Patient Details Name: Shannon Kelley MRN: [id number] DOB: 1945/02/01   Cancelled Treatment:    Reason Eval/Treat Not Completed: Other (comment). Per PT: Per primary RN, patient confused and yelling/moaning, receiving bolus due to hypotension. Not appropriate for OT at this time. Will continue efforts next date as medically appropriate.   Richrd Prime, MPH, MS, OTR/L ascom [phone] 11/29/19, 1:53 PM

## 2019-11-29 NOTE — Progress Notes (Signed)
PT Cancellation Note  Patient Details Name: Shannon Kelley MRN: [id number] DOB: [birth year]   Cancelled Treatment:    Reason Eval/Treat Not Completed:  (Evaulation re-attempted. Per primary RN, patient confused and yelling/moaning, receiving bolus due to hypotension; not appropriate for PT at this time.  Will continue efforts next date as medically appropriate.)   Negan Grudzien H. Manson Passey, PT, DPT, NCS 11/29/19, 1:24 PM [phone]

## 2019-11-29 NOTE — Progress Notes (Signed)
°   11/29/19 1246  Assess: MEWS Score  Temp 97.8 F (36.6 C)  BP (!) 77/41  Pulse Rate 74  ECG Heart Rate 73  Resp 17  SpO2 92 %  Assess: MEWS Score  MEWS Temp 0  MEWS Systolic 2  MEWS Pulse 0  MEWS RR 0  MEWS LOC 0  MEWS Score 2  MEWS Score Color Yellow  Assess: if the MEWS score is Yellow or Red  Were vital signs taken at a resting state? Yes  Focused Assessment No change from prior assessment  Early Detection of Sepsis Score *See Row Information* High  MEWS guidelines implemented *See Row Information* Yes  Treat  MEWS Interventions Escalated (See documentation below)  Pain Scale 0-10  Pain Score 0  Take Vital Signs  Increase Vital Sign Frequency  Yellow: Q 2hr X 2 then Q 4hr X 2, if remains yellow, continue Q 4hrs  Escalate  MEWS: Escalate Yellow: discuss with charge nurse/RN and consider discussing with provider and RRT  Notify: Charge Nurse/RN  Name of Charge Nurse/RN Notified Lillia Abed   Date Charge Nurse/RN Notified 11/29/19  Time Charge Nurse/RN Notified 1250  Notify: Provider  Provider Name/Title Pahwani  Date Provider Notified 11/29/19  Time Provider Notified 1250  Notification Type Call  Notification Reason Other (Comment) (unstable BP)  Response See new orders (bolus)  Date of Provider Response 11/29/19  Time of Provider Response 1250  Document  Patient Outcome Stabilized after interventions  Progress note created (see row info) Yes

## 2019-11-29 NOTE — ED Notes (Signed)
Pt's mouth swabbed with mouth swab

## 2019-11-29 NOTE — Progress Notes (Signed)
BP fluctuating mid 70's to low 80's. Attempting Trendelenburg but patient not tolerating well. MD aware and placing orders for fluid bolus. Will continue to monitor.

## 2019-11-29 NOTE — Progress Notes (Addendum)
OT Cancellation Note  Patient Details Name: Shannon Kelley MRN: [id number] DOB: [birth year]   Cancelled Treatment:    Reason Eval/Treat Not Completed: Fatigue/lethargy limiting ability to participate. Consult received, chart reviewed. Spoke with RN who reports pt just fell asleep. Per RN, pt from [facility] and has been bedbound since a hip fracture in April 2021. Will re-attempt OT evaluation at later time to allow for rest. RN in agreement with this plan and cleared therapy to work with pt once awake (noted with non-occlusive DVT to LLE).  Richrd Prime, MPH, MS, OTR/L ascom [phone] 11/29/19, 11:03 AM

## 2019-11-29 NOTE — ED Notes (Signed)
Pt transported to 2A-241 by this RN at this time.

## 2019-11-29 NOTE — Progress Notes (Signed)
BP and MAP still low after bolus. Per Dr. Jacqulyn Bath, patient will transfer to CCU when bed available.

## 2019-11-29 NOTE — ED Notes (Signed)
This RN to bedside at this time. Pt visualized resting comfortably in bed with eyes closed at this time. Lights dimmed for pt comfort at this time.

## 2019-11-29 NOTE — Progress Notes (Signed)
PT Cancellation Note  Patient Details Name: Shannon Kelley MRN: [id number] DOB: 02-22-1945   Cancelled Treatment:      Consult received, chart reviewed. Per OT, reports pt just fell asleep. Per RN, pt from [facility] and has been bedbound since a hip fracture in April 2021. Will re-attempt OT evaluation at later time to allow for rest. RN in agreement with this plan and cleared therapy to work with pt once awake (noted with non-occlusive DVT to LLE).   Anik Wesch H. Manson Passey, PT, DPT, NCS 11/29/19, 11:11 AM [phone]

## 2019-11-29 NOTE — Progress Notes (Addendum)
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  Per EMS: Patient was noted to be febrile with fever of 102, was noted to be hypoxic in 70s and was placed on nonrebreather.  She was found to have hypotension was started on IV fluids.  Lactic acid elevated at 5.7.  Evidence of AKI with creatinine up to 1.54.  AST: 87, alkaline phosphatase: 145, INR: 1.4.  UA showing possible UTI.  Code sepsis initiated and patient was started on broad-spectrum antibiotics Vanco and cefepime.  Left lower extremity Doppler ultrasound came back positive for acute DVT.  Patient started on heparin GTT.  Patient admitted for further evaluation and management of sepsis/septic shock.  Assessment & Plan:  Severe sepsis with septic shock:  -In the setting of possible UTI?Marland Kitchen  UA is positive for leukocyte, WBC and bacteria.  Patient febrile with fever of 103.5, tachycardia, hypotension, elevated lactic acid and leukocytosis of 13,000. -COVID-19 negative.  Reviewed CTA chest, CT abdomen/pelvis. -Patient started on broad-spectrum antibiotics vancomycin and cefepime in ED.  We will continue same.  MRSA PCR: Positive. -Received total of 4.5 L IV fluid since admission however with no improvement in blood pressure. -Consulted PCCM for further management as she may need pressor-patient will be transferred to ICU as soon as bed is available.  Appreciate PCCM help. -Procalcitonin: Elevated at 26.53.  Urine culture, blood culture: Pending -Trend lactic acid, procalcitonin level.  Continue to monitor vitals closely.  Nonocclusive left lower extremity  DVT: -History of DVT in the past with history of underlying antiphospholipid antibody syndrome -On Coumadin at home.  Was told to bridge with Lovenox however have not been started yet. -Continue with heparin GTT for now.  Acute hypoxemic respiratory failure: -Reviewed CTA chest.  No PE.  No pneumonia, no fluid overload.  COVID-19 negative.  Requiring 4 L of oxygen via nasal cannula. -We will try to wean off of oxygen as tolerated.  Acute metabolic encephalopathy: -Likely in the setting of severe sepsis with underlying history of dementia. -Patient mentation is improving. -Continue with IV hydration. -We will keep her n.p.o. -PT/OT/SLP consult -Fall/aspiration precautions  Macrocytic anemia -H&H dropped from 11.6-8.7 in 1 day.  MCV: 100.4 this morning. -Check B12, folate, iron studies.  Monitor H&H closely and transfuse as needed.  AKI: -Creatinine: 1.49, GFR: 37.  Continue with IV fluids.  Hold nephrotoxic medications.  Repeat BMP tomorrow a.m.  Hypomagnesemia: Replenished  Hypophosphatemia: Replenished  Hypercalcemia: In the setting of hyperparathyroidism.  Continue with IV fluids.   -Vitamin D level, PTH: Pending -Patient started on calcitonin 300 units every 12 hours for 4 doses  History of seizure disorder: -Continue phenobarbital and phenytoin  Prolonged QT interval: Reviewed EKG.  Replace electrolytes.  Continue IV fluids.  Monitor closely on telemetry.  Dementia: With a mild behavioral disturbance.  Resume home meds once able to take p.o.  Mild LFT elevation: -Improving.  DVT prophylaxis: Full dose heparin Code Status: partial code (no CPR, no intubation-okay with everything else)-confirmed with the patient Family Communication: None present at bedside.  Plan of care discussed with patient in length and she verbalized understanding and agreed with it.  I called patient's daughter and discussed about  patient's current status.  She is on high risk of increased  morbidity and mortality and she verbalized understanding.  Disposition Plan: SNF once stable Consultants:   PCCM  Procedures:  CT abdomen/pelvis with contrast CTA chest Doppler ultrasound of left lower extremity  Antimicrobials:   .  Cefepime  Vancomycin  Status is: Inpatient  Dispo: The patient is from: SNF-PT resources              Anticipated d/c is to: SNF              Anticipated d/c date is: >3 days              Patient currently not medically stable for the discharge   Subjective: Patient seen and examined.  Tells me that she feels warmer.  Appears very dehydrated, weak, lethargic and very anxious.  Denies chest pain, shortness of breath, vomiting, headache or diarrhea.  Objective: Vitals:   11/29/19 1231 11/29/19 1246 11/29/19 1417 11/29/19 1426  BP: (!) 89/40 (!) 77/41 (!) 86/51 (!) 84/39  Pulse:  74 79 78  Resp:  Temp:  97.8 F (36.6 C)  98.7 F (37.1 C)  TempSrc:    Oral  SpO2:  92% 100% 100%  Weight:      Height:        Intake/Output Summary (Last 24 hours) at 11/29/2019 1451 Last data filed at 11/29/2019 1345 Gross per 24 hour  Intake 1000 ml  Output --  Net 1000 ml   Filed Weights   11/28/19 1837  Weight: 74.8 kg    Examination:  General exam: Appears calm and comfortable, moaning and crying, on nasal cannula, appears dehydrated, weak and lethargic.  Very anxious Respiratory system: Clear to auscultation. Respiratory effort normal. Cardiovascular system: S1 & S2 heard, RRR. No JVD, murmurs, rubs, gallops or clicks. No pedal edema. Gastrointestinal system: Abdomen is nondistended, soft and nontender. No organomegaly or masses felt. Normal bowel sounds heard. Central nervous system: Alert and oriented. No focal neurological deficits. Extremities: Symmetric 5 x 5 power. Skin: No rashes, lesions or ulcers   Data Reviewed: I have personally reviewed following labs and imaging studies  CBC: Recent Labs  Lab 11/28/19 1836  11/29/19 0810  WBC 8.2 13.0*  NEUTROABS 7.7 11.9*  HGB 11.6* 8.7*  HCT 35.8* 25.2*  MCV 105.6* 100.4*  PLT 367 242   Basic Metabolic Panel: Recent Labs  Lab 11/28/19 1836 11/28/19 2322 11/29/19 0320 11/29/19 0810  NA 137  --  135  --   K 4.7  --  3.6  --   CL 101  --  106  --   CO2 19*  --  20*  --   GLUCOSE 94  --  149*  --   BUN 24*  --  22  --   CREATININE 1.45*  --  1.49*  --   CALCIUM 12.2* 11.1* 10.4* 10.0  MG  --  1.4* 2.1  --   PHOS  --  <1.0* 1.2*  --    GFR: Estimated Creatinine Clearance: 34.3 mL/min (A) (by C-G formula based on SCr of 1.49 mg/dL (H)). Liver Function Tests: Recent Labs  Lab 11/28/19 1836 11/29/19 0320  AST 87* 53*  ALT 43 30  ALKPHOS 145* 104  BILITOT 0.8 1.1  PROT 8.1 6.3*  ALBUMIN 3.7 2.7*   No results for input(s): LIPASE, AMYLASE in the last 168 hours. No results for input(s): AMMONIA in the last 168 hours. Coagulation  Profile: Recent Labs  Lab 11/28/19 1836  INR 1.4*   Cardiac Enzymes: Recent Labs  Lab 11/28/19 2322  CKTOTAL 79   BNP (last 3 results) No results for input(s): PROBNP in the last 8760 hours. HbA1C: No results for input(s): HGBA1C in the last 72 hours. CBG: No results for input(s): GLUCAP in the last 168 hours. Lipid Profile: No results for input(s): CHOL, HDL, LDLCALC, TRIG, CHOLHDL, LDLDIRECT in the last 72 hours. Thyroid Function Tests: Recent Labs    11/29/19 0320  TSH 0.898   Anemia Panel: No results for input(s): VITAMINB12, FOLATE, FERRITIN, TIBC, IRON, RETICCTPCT in the last 72 hours. Sepsis Labs: Recent Labs  Lab 11/28/19 1836 11/28/19 2054 11/28/19 2322 11/29/19 0320  PROCALCITON  --   --  26.53  --   LATICACIDVEN 5.7* 3.8* 5.1* 2.4*    Recent Results (from the past 240 hour(s))  Blood Culture (routine x 2)     Status: None (Preliminary result)   Collection Time: 11/28/19  6:36 PM   Specimen: BLOOD  Result Value Ref Range Status   Specimen Description BLOOD BLOOD RIGHT HAND   Final   Special Requests   Final    BOTTLES DRAWN AEROBIC AND ANAEROBIC Blood Culture results may not be optimal due to an inadequate volume of blood received in culture bottles   Culture   Final    NO GROWTH < 12 HOURS Performed at [facility], [address], [place], [place] [address]    Report Status PENDING  Incomplete  Blood Culture (routine x 2)     Status: None (Preliminary result)   Collection Time: 11/28/19  6:41 PM   Specimen: BLOOD  Result Value Ref Range Status   Specimen Description BLOOD BLOOD LEFT HAND  Final   Special Requests   Final    BOTTLES DRAWN AEROBIC AND ANAEROBIC Blood Culture results may not be optimal due to an inadequate volume of blood received in culture bottles   Culture   Final    NO GROWTH < 12 HOURS Performed at [facility], [address] [place], [place] [address]    Report Status PENDING  Incomplete  Respiratory Panel by RT PCR (Flu A&B, Covid) - Nasopharyngeal Swab     Status: None   Collection Time: 11/28/19  6:42 PM   Specimen: Nasopharyngeal Swab  Result Value Ref Range Status   SARS Coronavirus 2 by RT PCR NEGATIVE NEGATIVE Final    Comment: (NOTE) SARS-CoV-2 target nucleic acids are NOT DETECTED.  The SARS-CoV-2 RNA is generally detectable in upper respiratoy specimens during the acute phase of infection. The lowest concentration of SARS-CoV-2 viral copies this assay can detect is 131 copies/mL. A negative result does not preclude SARS-Cov-2 infection and should not be used as the sole basis for treatment or other patient management decisions. A negative result may occur with  improper specimen collection/handling, submission of specimen other than nasopharyngeal swab, presence of viral mutation(s) within the areas targeted by this assay, and inadequate number of viral copies (<131 copies/mL). A negative result must be combined with clinical observations, patient history, and epidemiological information.  The expected result is Negative.  Fact Sheet for Patients:  https://www.moore.com/  Fact Sheet for Healthcare Providers:  https://www.young.biz/  This test is no t yet approved or cleared by the [place] FDA and  has been authorized for detection and/or diagnosis of SARS-CoV-2 by FDA under an Emergency Use Authorization (EUA). This EUA will remain  in effect (meaning this  test can be used) for the duration of the COVID-19 declaration under Section 564(b)(1) of the Act, 21 U.S.C. section 360bbb-3(b)(1), unless the authorization is terminated or revoked sooner.     Influenza A by PCR NEGATIVE NEGATIVE Final   Influenza B by PCR NEGATIVE NEGATIVE Final    Comment: (NOTE) The Xpert Xpress SARS-CoV-2/FLU/RSV assay is intended as an aid in  the diagnosis of influenza from Nasopharyngeal swab specimens and  should not be used as a sole basis for treatment. Nasal washings and  aspirates are unacceptable for Xpert Xpress SARS-CoV-2/FLU/RSV  testing.  Fact Sheet for Patients: https://www.moore.com/  Fact Sheet for Healthcare Providers: https://www.young.biz/  This test is not yet approved or cleared by the [place] FDA and  has been authorized for detection and/or diagnosis of SARS-CoV-2 by  FDA under an Emergency Use Authorization (EUA). This EUA will remain  in effect (meaning this test can be used) for the duration of the  Covid-19 declaration under Section 564(b)(1) of the Act, 21  U.S.C. section 360bbb-3(b)(1), unless the authorization is  terminated or revoked. Performed at [facility], [address] [place], [place] [address]   MRSA PCR Screening     Status: Abnormal   Collection Time: 11/29/19 10:46 AM   Specimen: Nasal Mucosa; Nasopharyngeal  Result Value Ref Range Status   MRSA by PCR POSITIVE (A) NEGATIVE Final    Comment:        The GeneXpert MRSA Assay (FDA approved  for NASAL specimens only), is one component of a comprehensive MRSA colonization surveillance program. It is not intended to diagnose MRSA infection nor to guide or monitor treatment for MRSA infections. RESULT CALLED TO, READ BACK BY AND VERIFIED WITH: Hyman Hopes 11/29/19 1210 KLW Performed at [facility], [address] [place], [place] [address]       Radiology Studies: CT Angio Chest PE W and/or Wo Contrast  Result Date: 11/28/2019 CLINICAL DATA:  Fever, short of breath, abdominal pain EXAM: CT ANGIOGRAPHY CHEST CT ABDOMEN AND PELVIS WITH CONTRAST TECHNIQUE: Multidetector CT imaging of the chest was performed using the standard protocol during bolus administration of intravenous contrast. Multiplanar CT image reconstructions and MIPs were obtained to evaluate the vascular anatomy. Multidetector CT imaging of the abdomen and pelvis was performed using the standard protocol during bolus administration of intravenous contrast. CONTRAST:  62mL OMNIPAQUE IOHEXOL 350 MG/ML SOLN COMPARISON:  01/03/2009, 11/28/2019 FINDINGS: CTA CHEST FINDINGS Cardiovascular: This is a technically adequate evaluation of the pulmonary vasculature. There are 2 small eccentric filling defects within the left lower lobe segmental pulmonary arteries, which have an appearance most consistent with sequela of chronic thromboembolic disease. This is best seen on image 132 of series 5 and image 141 of series 5. No other signs of additional filling defects or acute pulmonary emboli. The heart is unremarkable without pericardial effusion. Mediastinum/Nodes: No enlarged mediastinal, hilar, or axillary lymph nodes. Thyroid gland, trachea, and esophagus demonstrate no significant findings. Lungs/Pleura: No acute airspace disease, effusion, or pneumothorax. Scattered hypoventilatory changes versus scarring at the lung bases. Central airways are patent. Musculoskeletal: No acute displaced fracture. Sclerosis and  invagination of the superior endplate of T11 consistent with chronic compression fracture. Reconstructed images demonstrate no additional findings. Review of the MIP images confirms the above findings. CT ABDOMEN and PELVIS FINDINGS Hepatobiliary: No focal liver abnormality is seen. Noncalcified gallstones seen within the gallbladder neck, best visualized on delayed images. No gallbladder wall thickening or signs of acute cholecystitis. Pancreas: Unremarkable.  No pancreatic ductal dilatation or surrounding inflammatory changes. Spleen: Normal in size without focal abnormality. Adrenals/Urinary Tract: Small left renal cortical cysts are noted. Mild bilateral renal cortical atrophy. Punctate calcifications are seen at the renal hila, likely vascular. No definite urinary tract calculi or obstructive uropathy. The adrenals and bladder are unremarkable. Stomach/Bowel: No bowel obstruction or ileus. Normal appendix right lower quadrant. No bowel wall thickening or inflammatory change. Vascular/Lymphatic: Aortic atherosclerosis. No enlarged abdominal or pelvic lymph nodes. Reproductive: Uterus and bilateral adnexa are unremarkable. Other: No free fluid or free gas. No abdominal wall hernia. Musculoskeletal: Chronic appearing compression deformities are seen at T11 and L1. There are no acute displaced fractures. Bilateral hip arthroplasties are unremarkable. Reconstructed images demonstrate no additional findings. Review of the MIP images confirms the above findings. IMPRESSION: 1. Small eccentric filling defects within the left lower lobe segmental pulmonary arteries, which have an appearance most consistent with sequela of chronic thromboembolic disease. No signs of acute pulmonary emboli. 2. Noncalcified gallstone.  No evidence of acute cholecystitis. 3. Aortic Atherosclerosis (ICD10-I70.0). Electronically Signed   By: Sharlet SalinaMichael  Brown M.D.   On: 11/28/2019 20:21   CT ABDOMEN PELVIS W CONTRAST  Result Date:  11/28/2019 CLINICAL DATA:  Fever, short of breath, abdominal pain EXAM: CT ANGIOGRAPHY CHEST CT ABDOMEN AND PELVIS WITH CONTRAST TECHNIQUE: Multidetector CT imaging of the chest was performed using the standard protocol during bolus administration of intravenous contrast. Multiplanar CT image reconstructions and MIPs were obtained to evaluate the vascular anatomy. Multidetector CT imaging of the abdomen and pelvis was performed using the standard protocol during bolus administration of intravenous contrast. CONTRAST:  60mL OMNIPAQUE IOHEXOL 350 MG/ML SOLN COMPARISON:  01/03/2009, 11/28/2019 FINDINGS: CTA CHEST FINDINGS Cardiovascular: This is a technically adequate evaluation of the pulmonary vasculature. There are 2 small eccentric filling defects within the left lower lobe segmental pulmonary arteries, which have an appearance most consistent with sequela of chronic thromboembolic disease. This is best seen on image 132 of series 5 and image 141 of series 5. No other signs of additional filling defects or acute pulmonary emboli. The heart is unremarkable without pericardial effusion. Mediastinum/Nodes: No enlarged mediastinal, hilar, or axillary lymph nodes. Thyroid gland, trachea, and esophagus demonstrate no significant findings. Lungs/Pleura: No acute airspace disease, effusion, or pneumothorax. Scattered hypoventilatory changes versus scarring at the lung bases. Central airways are patent. Musculoskeletal: No acute displaced fracture. Sclerosis and invagination of the superior endplate of T11 consistent with chronic compression fracture. Reconstructed images demonstrate no additional findings. Review of the MIP images confirms the above findings. CT ABDOMEN and PELVIS FINDINGS Hepatobiliary: No focal liver abnormality is seen. Noncalcified gallstones seen within the gallbladder neck, best visualized on delayed images. No gallbladder wall thickening or signs of acute cholecystitis. Pancreas: Unremarkable. No  pancreatic ductal dilatation or surrounding inflammatory changes. Spleen: Normal in size without focal abnormality. Adrenals/Urinary Tract: Small left renal cortical cysts are noted. Mild bilateral renal cortical atrophy. Punctate calcifications are seen at the renal hila, likely vascular. No definite urinary tract calculi or obstructive uropathy. The adrenals and bladder are unremarkable. Stomach/Bowel: No bowel obstruction or ileus. Normal appendix right lower quadrant. No bowel wall thickening or inflammatory change. Vascular/Lymphatic: Aortic atherosclerosis. No enlarged abdominal or pelvic lymph nodes. Reproductive: Uterus and bilateral adnexa are unremarkable. Other: No free fluid or free gas. No abdominal wall hernia. Musculoskeletal: Chronic appearing compression deformities are seen at T11 and L1. There are no acute displaced fractures. Bilateral hip arthroplasties are unremarkable. Reconstructed images demonstrate no  additional findings. Review of the MIP images confirms the above findings. IMPRESSION: 1. Small eccentric filling defects within the left lower lobe segmental pulmonary arteries, which have an appearance most consistent with sequela of chronic thromboembolic disease. No signs of acute pulmonary emboli. 2. Noncalcified gallstone.  No evidence of acute cholecystitis. 3. Aortic Atherosclerosis (ICD10-I70.0). Electronically Signed   By: Sharlet Salina M.D.   On: 11/28/2019 20:21   US Venous Img Lower Unilateral Left  Result Date: 11/28/2019 CLINICAL DATA:  Leg pain EXAM: LEFT LOWER EXTREMITY VENOUS DOPPLER ULTRASOUND TECHNIQUE: Gray-scale sonography with compression, as well as color and duplex ultrasound, were performed to evaluate the deep venous system(s) from the level of the common femoral vein through the popliteal and proximal calf veins. COMPARISON:  None. FINDINGS: VENOUS Nonocclusive thrombus is noted throughout the left common femoral vein, femoral vein, and popliteal vein. The  waveforms within the femoral vein and common femoral vein are unremarkable. There is decreased phasicity within the popliteal vein consistent with a more proximal thrombus. The tibial vasculature was not well visualized on this study. Limited views of the contralateral common femoral vein are unremarkable. OTHER None. Limitations: none IMPRESSION: Nonocclusive DVT throughout the left lower extremity as detailed above. Electronically Signed   By: Katherine Mantle M.D.   On: 11/28/2019 23:18   DG Chest Port 1 View  Result Date: 11/28/2019 CLINICAL DATA:  Questionable sepsis.  Evaluate for pneumonia. EXAM: PORTABLE CHEST 1 VIEW COMPARISON:  03/30/2017 FINDINGS: The stable cardiomediastinal contours. No pleural effusion identified. No airspace consolidation identified to suggest pneumonia. Healing left distal clavicle fracture. IMPRESSION: No active disease. Electronically Signed   By: Signa Kell M.D.   On: 11/28/2019 19:31    Scheduled Meds: . calcitonin  4 Units/kg Subcutaneous Q12H  . [START ON 11/30/2019] Chlorhexidine Gluconate Cloth  6 each Topical Q0600  . cinacalcet  30 mg Oral BID WC  . feeding supplement  237 mL Oral BID BM  . mouth rinse  15 mL Mouth Rinse BID  . midodrine  10 mg Oral Once  . [START ON 11/30/2019] multivitamin with minerals  1 tablet Oral Daily  . mupirocin ointment  1 application Nasal BID  . PHENObarbital  60 mg Intravenous Q12H  . phenytoin (DILANTIN) IV  100 mg Intravenous Q12H   Continuous Infusions: . sodium chloride 150 mL/hr at 11/29/19 1057  . ceFEPime (MAXIPIME) IV Stopped (11/29/19 9147)  . heparin 1,200 Units/hr (11/29/19 0446)  . vancomycin 1,000 mg (11/29/19 1050)     LOS: 1 day   Time spent: 45 minutes   Anquanette Bahner Estill Cotta, MD [facility]  If 7PM-7AM, please contact night-coverage [email or web address] 11/29/2019, 2:51 PM

## 2019-11-29 NOTE — Progress Notes (Signed)
Following for Code Sepsis  

## 2019-11-29 NOTE — Progress Notes (Signed)
Per Dr. Jacqulyn Bath, change IV fluids order to continuous instead of expiring now.

## 2019-11-29 NOTE — ED Notes (Signed)
Pt cleansed of urine and placed in new brief

## 2019-11-29 NOTE — Progress Notes (Addendum)
ANTICOAGULATION CONSULT NOTE - Consult  Pharmacy Consult for Heparin Indication: VTE treatment  No Known Allergies  Patient Measurements: Height: 5\' 6"  (167.6 cm) Weight: 74.8 kg (165 lb) IBW/kg (Calculated) : 59.3 HEPARIN DW (KG): 74.3  Vital Signs: Temp: 97.9 F (36.6 C) (11/11 1224) Temp Source: Oral (11/11 1224) BP: 89/40 (11/11 1231) Pulse Rate: 79 (11/11 1224)  Labs: Recent Labs    11/28/19 1836 11/28/19 2322 11/29/19 0320 11/29/19 0810  HGB 11.6*  --   --  8.7*  HCT 35.8*  --   --  25.2*  PLT 367  --   --  242  APTT <24*  --   --   --   LABPROT 16.7*  --   --   --   INR 1.4*  --   --   --   HEPARINUNFRC  --   --   --  0.54  CREATININE 1.45*  --  1.49*  --   CKTOTAL  --  79  --   --    Estimated Creatinine Clearance: 34.3 mL/min (A) (by C-G formula based on SCr of 1.49 mg/dL (H)).  Medical History: Past Medical History:  Diagnosis Date  . Epilepsy (HCC)     Medications:  Medications Prior to Admission  Medication Sig Dispense Refill Last Dose  . acetaminophen (TYLENOL) 325 MG tablet Take 2 tablets by mouth every 4 (four) hours as needed.   PRN at PRN  . Cholecalciferol 25 MCG (1000 UT) capsule Take 2 capsules by mouth at bedtime.   11/27/2019 at 2000  . cinacalcet (SENSIPAR) 30 MG tablet Take 30 mg by mouth 2 (two) times daily with a meal.   11/27/2019 at 1800  . lidocaine (LIDODERM) 5 % Place 2 patches onto the skin daily. Remove & Discard patch within 12 hours or as directed by MD (Apply to lower back every morning)   11/28/2019 at 0900  . magnesium oxide (MAG-OX) 400 MG tablet Take 400 mg by mouth in the morning, at noon, and at bedtime.    11/28/2019 at 1200  . midodrine (PROAMATINE) 10 MG tablet Take 10 mg by mouth 3 (three) times daily.   11/28/2019 at 1130  . Multiple Vitamins-Minerals (THERA-M) TABS Take 1 tablet by mouth daily.   11/28/2019 at 0900  . PHENobarbital (LUMINAL) 64.8 MG tablet Take 64.8 mg by mouth 2 (two) times daily.   11/28/2019 at  0900  . phenytoin (DILANTIN) 100 MG ER capsule Take 100-200 mg by mouth 2 (two) times daily. Take 2 capsules (200mg ) q day and 1 capsule (100mg ) qhs   11/28/2019 at 0900  . polyethylene glycol (MIRALAX / GLYCOLAX) 17 g packet Take 17 g by mouth daily as needed.   prn at prn  . warfarin (COUMADIN) 5 MG tablet Take 7 mg by mouth daily.    11/27/2019 at 1800  . hydrOXYzine (ATARAX/VISTARIL) 25 MG tablet Take 25 mg by mouth every 8 (eight) hours as needed for anxiety. (Patient not taking: Reported on 11/29/2019)   Not Taking at Unknown time  . hydrOXYzine (VISTARIL) 25 MG capsule Take 1 capsule by mouth every 8 (eight) hours as needed for anxiety. (Patient not taking: Reported on 11/29/2019)   Not Taking at Unknown time  . QUEtiapine (SEROQUEL) 25 MG tablet Take 25 mg by mouth at bedtime.  (Patient not taking: Reported on 11/29/2019)   Not Taking at Unknown  . sertraline (ZOLOFT) 50 MG tablet Take 50 mg by mouth daily. (Patient not taking: Reported  on 11/29/2019)   Not Taking at Unknown time    Assessment: H/H downtrended, PLTs 219>758.  Initiating Heparin for VTE treatment.  Pt transitioned from subtherapeutic warfarin to Heparin infusion while inpatient for treatment of pre-existing non-acute PE visualized on imaging. Heparin level was therapeutic x1 at 0.54 on 11/11 0810.   Baseline INR = 1.4, aPTT = < 24. Date HL Dosing Rate 11/11 0.54 1200 units/hr  Goal of Therapy:  Heparin level 0.3-0.7 units/ml Monitor platelets by anticoagulation protocol: Yes   Plan:  Continue with current infusion rate at 1200 units/hr Recheck HL in 8 hours (11/11 1600) Continue to monitor CBC daily per protocol  Sherlynn Carbon Sahalie Beth 11/29/2019,1:55 PM

## 2019-11-29 NOTE — Consult Note (Signed)
NAME:  Shannon Kelley [place], MRN:  [id number], DOB:  Jun 13, 1945, LOS: 1 ADMISSION DATE:  11/28/2019, CONSULTATION DATE:  11/29/19 REFERRING MD:  Dr. Jacqulyn BathPahwani, CHIEF COMPLAINT:  Fever  Brief History   74 yo female presenting with suspected urosepsis and septic shock requiring vasopressors admitted to the ICU.  History of present illness   74 year old female presented to the ED via EMS from [facility] with dyspnea, hypotension, fever, tachycardia and hypoxia.  Per ED documentation the staff at [facility] checked her O2 and it was in the 70s so EMS was called. Upon arrival to the ED, EMS reported the patient as febrile at 102, with SpO2 in the 70's requiring a non rebreather and stated the patient had been vomiting at the SNF. 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 ED workup demonstrated labs significant for: Lactic 3.8~5.1~2.4, PCT 26.53~31.92, Ca+11.1~10.4~10, UA + UTI, CTa negative for PE.  Sepsis protocol was initiated, and the patient was transitioned from coumadin to heparin drip for suspected DVT. The patient was admitted to the ICU with PCCM consulted due to hypotension requiring vasopressors.  Past Medical History  Epilepsy DVT on warfarin Hyperparathyroidism Dementia  Significant Hospital Events   11/29/19 Admit to ICU   Consults:    Procedures:    Significant Diagnostic Tests:  11/28/19 CTa >> small eccentric filling defects within the LLL segmental pulmonary arteries most consistent with chronic thromboembolic disease. No signs of acute pulmonary emboli. 11/28/19 US BLE >> nonocclusive DVT throughout LLE  Micro Data:  11/28/19 COVID- 19 >> negative 11/28/19 Influenza A/[place] >> negative 11/29/19 MRSA PCR >> positive 11/28/19 BC x 2 >> 11/28/19 UC >>  Antimicrobials:  11/28/19 Cefepime >>  11/28/19 Metronidazole x 1 11/28/19 Vancomycin >>  Interim history/subjective:  Patient lying in bed resting, roused easily,  reports that her LLE had been hurting and "feeling weird". We discussed the treatment she is receiving for the DVT in her LLE as well as the antibiotics for the suspected UTI.  She reported taking coumadin at home due to previous DVT's dating back to the 1980's. Patient reported being comfortable at this time, just tired.  Labs/ Imaging personally reviewed Na+/ K+: 135/ 3.6 BUN/Cr.: 22/ 1.49 Hgb: 8.7  WBC/ TMAX: 13/ 103.4 Lactic/ PCT: 5.1~2.4~1.5/ 26.53~31.92  CXR 11/28/19: no active disease  Objective   Blood pressure (!) 120/52, pulse 88, temperature 98.2 F (36.8 C), temperature source Oral, resp. rate (!) 21, height 5\' 10"  (1.778 m), weight 82.3 kg, SpO2 100 %.        Intake/Output Summary (Last 24 hours) at 11/29/2019 2318 Last data filed at 11/29/2019 1900 Gross per 24 hour  Intake 3944.18 ml  Output 300 ml  Net 3644.18 ml   Filed Weights   11/28/19 1837 11/29/19 1751  Weight: 74.8 kg 82.3 kg    Examination: General: Adult female, critically ill, lying in bed, NAD HEENT: MM pink/dry, anicteric, atraumatic, neck supple Neuro: A&O x 3 with some delayed responses, follows commands, PERRL +3, MAE with generalized weakness CV: s1s2 RRR, NSR on monitor, no r/m/g Pulm: Regular, non labored on room air, breath sounds clear/diminished throughout GI: soft, rounded, non tender, bs x 4 GU: purewick in place with clear yellow urine noted Skin: excoriation noted on BLE Extremities: warm/dry, pulses + 2 R/P, trace edema noted  Resolved Hospital Problem list     Assessment & Plan:  Sepsis with septic shock due to suspected urinary tract infection  Upon arrival to the ED, EMS reported the patient as febrile at 102, with SpO2 in the 70's requiring a non-rebreather. UA + for UTI, patient hypotensive requiring peripheral low dose levophed.  - continue IVF resuscitation - continue Levophed to maintain MAP > 65 - continue Cefepime & vancomycin per pharmacy consult - follow BC & UC -  monitor WBC & fever curve - trend PCT - continuous cardiac monitoring  Deep Vein Thrombosis in LLE PMHx: DVT on warfarin Patient reports that her LLE had been hurting and "feeling weird". [place] BLE positive for LLE DVT. Cta negative for PE. - continue heparin drip per pharmacy for DVT - Monitor for s/s of bleeding - monitor CBC, PT/INR  Acute Hypoxic Respiratory Failure No PMHx of lung disease. Patient dyspneic at SNF, SpO2 in 70's per EMS, currently stable on 4L [place]. CXR clear, Cta negative for PE - continuous SpO2 monitoring - supplemental O2 PRN to maintain SpO2 >90%  Epilepsy - continue home medications: phenobarbital & phenytoin - seizure and aspiration precautions  Best practice:  Diet: DYS 3, thin liquid Pain/Anxiety/Delirium protocol (if indicated): N/A VAP protocol (if indicated): N/A DVT prophylaxis: Heparin drip GI prophylaxis: N/A Glucose control: monitor Q 4 Mobility: bedrest, mobilize as tolerated Code Status: Partial- no CPR/intubation; administer ACLS meds/ Bipap/ defibrillation/ cardioversion Family Communication: patient updated bedside, daughter will be updated by day team Disposition: ICU  Labs   CBC: Recent Labs  Lab 11/28/19 1836 11/29/19 0810  WBC 8.2 13.0*  NEUTROABS 7.7 11.9*  HGB 11.6* 8.7*  HCT 35.8* 25.2*  MCV 105.6* 100.4*  PLT 367 242    Basic Metabolic Panel: Recent Labs  Lab 11/28/19 1836 11/28/19 2322 11/29/19 0320 11/29/19 0810 11/29/19 1546  NA 137  --  135  --   --   K 4.7  --  3.6  --   --   CL 101  --  106  --   --   CO2 19*  --  20*  --   --   GLUCOSE 94  --  149*  --   --   BUN 24*  --  22  --   --   CREATININE 1.45*  --  1.49*  --   --   CALCIUM 12.2* 11.1* 10.4* 10.0 9.1  MG  --  1.4* 2.1  --   --   PHOS  --  <1.0* 1.2*  --   --    GFR: Estimated Creatinine Clearance: 38.7 mL/min (A) (by C-G formula based on SCr of 1.49 mg/dL (H)). Recent Labs  Lab 11/28/19 1836 11/28/19 2054 11/28/19 2322 11/29/19 0320  11/29/19 0810 11/29/19 1546 11/29/19 1840  PROCALCITON  --   --  26.53  --   --  31.92  --   WBC 8.2  --   --   --  13.0*  --   --   LATICACIDVEN 5.7*   < > 5.1* 2.4*  --  1.0 1.5   < > = values in this interval not displayed.    Liver Function Tests: Recent Labs  Lab 11/28/19 1836 11/29/19 0320  AST 87* 53*  ALT 43 30  ALKPHOS 145* 104  BILITOT 0.8 1.1  PROT 8.1 6.3*  ALBUMIN 3.7 2.7*   No results for input(s): LIPASE, AMYLASE in the last 168 hours. No results for input(s): AMMONIA in the last 168 hours.  ABG    Component Value Date/Time   HCO3 23.5 11/28/2019 2204   O2SAT 17.3 11/28/2019  2204     Coagulation Profile: Recent Labs  Lab 11/28/19 1836  INR 1.4*    Cardiac Enzymes: Recent Labs  Lab 11/28/19 2322  CKTOTAL 79    HbA1C: No results found for: HGBA1C  CBG: No results for input(s): GLUCAP in the last 168 hours.  Review of Systems: Positives in BOLD  Gen: Denies fever, chills, weight change, fatigue, night sweats HEENT: Denies blurred vision, double vision, hearing loss, tinnitus, sinus congestion, rhinorrhea, sore throat, neck stiffness, dysphagia PULM: Denies shortness of breath, cough, sputum production, hemoptysis, wheezing CV: Denies chest pain, edema, orthopnea, paroxysmal nocturnal dyspnea, palpitations GI: Denies abdominal pain, nausea, vomiting, diarrhea, hematochezia, melena, constipation, change in bowel habits GU: Denies dysuria, hematuria, polyuria, oliguria, urethral discharge Endocrine: Denies hot or cold intolerance, polyuria, polyphagia or appetite change Derm: Denies rash, dry skin, scaling or peeling skin change Heme: Denies easy bruising, bleeding, bleeding gums, pain in LLE with exertion Neuro: Denies headache, numbness, weakness, slurred speech, loss of memory or consciousness  Past Medical History  She,  has a past medical history of Epilepsy (HCC).   Surgical History    Past Surgical History:  Procedure Laterality  Date  . FRACTURE SURGERY       Social History   reports that she has quit smoking. She has never used smokeless tobacco. She reports previous alcohol use. She reports previous drug use.   Family History   Her family history includes Diabetes in her father and another family member.   Allergies No Known Allergies   Home Medications  Prior to Admission medications   Medication Sig Start Date End Date Taking? Authorizing Provider  acetaminophen (TYLENOL) 325 MG tablet Take 2 tablets by mouth every 4 (four) hours as needed. 10/24/19  Yes [provider]  Cholecalciferol 25 MCG (1000 UT) capsule Take 2 capsules by mouth at bedtime. 10/24/19  Yes [provider]  cinacalcet (SENSIPAR) 30 MG tablet Take 30 mg by mouth 2 (two) times daily with a meal.   Yes [provider]  lidocaine (LIDODERM) 5 % Place 2 patches onto the skin daily. Remove & Discard patch within 12 hours or as directed by MD (Apply to lower back every morning)   Yes [provider]  magnesium oxide (MAG-OX) 400 MG tablet Take 400 mg by mouth in the morning, at noon, and at bedtime.    Yes [provider]  midodrine (PROAMATINE) 10 MG tablet Take 10 mg by mouth 3 (three) times daily.   Yes [provider]  Multiple Vitamins-Minerals (THERA-M) TABS Take 1 tablet by mouth daily. 06/08/19  Yes [provider]  PHENobarbital (LUMINAL) 64.8 MG tablet Take 64.8 mg by mouth 2 (two) times daily.   Yes [provider]  phenytoin (DILANTIN) 100 MG ER capsule Take 100-200 mg by mouth 2 (two) times daily. Take 2 capsules (200mg ) q day and 1 capsule (100mg ) qhs   Yes [provider]  polyethylene glycol (MIRALAX / GLYCOLAX) 17 g packet Take 17 g by mouth daily as needed.   Yes [provider]  warfarin (COUMADIN) 5 MG tablet Take 7 mg by mouth daily.    Yes [provider]  hydrOXYzine (ATARAX/VISTARIL) 25 MG tablet Take 25 mg by mouth every 8  (eight) hours as needed for anxiety. Patient not taking: Reported on 11/29/2019    [provider]  hydrOXYzine (VISTARIL) 25 MG capsule Take 1 capsule by mouth every 8 (eight) hours as needed for anxiety. Patient not taking:  Reported on 11/29/2019 10/24/19   [provider]  QUEtiapine (SEROQUEL) 25 MG tablet Take 25 mg by mouth at bedtime.  Patient not taking: Reported on 11/29/2019    [provider]  sertraline (ZOLOFT) 50 MG tablet Take 50 mg by mouth daily. Patient not taking: Reported on 11/29/2019    [provider]     Critical care time: 35 minutes      Betsey Holiday, AGACNP-BC Acute Care Nurse Practitioner [facility]   [phone] / [phone] Please see Amion for pager details.

## 2019-11-30 LAB — CBC
HCT: 23.9 % — ABNORMAL LOW (ref 36.0–46.0)
HCT: 24.8 % — ABNORMAL LOW (ref 36.0–46.0)
Hemoglobin: 8 g/dL — ABNORMAL LOW (ref 12.0–15.0)
Hemoglobin: 8.7 g/dL — ABNORMAL LOW (ref 12.0–15.0)
MCH: 34.3 pg — ABNORMAL HIGH (ref 26.0–34.0)
MCH: 35.5 pg — ABNORMAL HIGH (ref 26.0–34.0)
MCHC: 33.5 g/dL (ref 30.0–36.0)
MCHC: 35.1 g/dL (ref 30.0–36.0)
MCV: 102.6 fL — ABNORMAL HIGH (ref 80.0–100.0)
MCV: 101.2 fL — ABNORMAL HIGH (ref 80.0–100.0)
Platelets: 207 10*3/uL (ref 150–400)
Platelets: 215 10*3/uL (ref 150–400)
RBC: 2.33 MIL/uL — ABNORMAL LOW (ref 3.87–5.11)
RBC: 2.45 MIL/uL — ABNORMAL LOW (ref 3.87–5.11)
RDW: 13.2 % (ref 11.5–15.5)
RDW: 13.2 % (ref 11.5–15.5)
WBC: 8.3 10*3/uL (ref 4.0–10.5)
WBC: 7.7 10*3/uL (ref 4.0–10.5)
nRBC: 0 % (ref 0.0–0.2)
nRBC: 0 % (ref 0.0–0.2)

## 2019-11-30 LAB — GLUCOSE, CAPILLARY
Glucose-Capillary: 83 mg/dL (ref 70–99)
Glucose-Capillary: 95 mg/dL (ref 70–99)
Glucose-Capillary: 109 mg/dL — ABNORMAL HIGH (ref 70–99)
Glucose-Capillary: 81 mg/dL (ref 70–99)
Glucose-Capillary: 74 mg/dL (ref 70–99)
Glucose-Capillary: 71 mg/dL (ref 70–99)

## 2019-11-30 LAB — BLOOD GAS, VENOUS
Acid-Base Excess: 0.3 mmol/L (ref 0.0–2.0)
Bicarbonate: 23.5 mmol/L (ref 20.0–28.0)
O2 Saturation: 17.3 %
Patient temperature: 37
pCO2, Ven: 33 mmHg — ABNORMAL LOW (ref 44.0–60.0)
pH, Ven: 7.46 — ABNORMAL HIGH (ref 7.250–7.430)
pO2, Ven: <31 mmHg — CL (ref 32.0–45.0)

## 2019-11-30 LAB — COMPREHENSIVE METABOLIC PANEL
ALT: 25 U/L (ref 0–44)
AST: 40 U/L (ref 15–41)
Albumin: 2.4 g/dL — ABNORMAL LOW (ref 3.5–5.0)
Alkaline Phosphatase: 88 U/L (ref 38–126)
Anion gap: 6 (ref 5–15)
BUN: 21 mg/dL (ref 8–23)
CO2: 20 mmol/L — ABNORMAL LOW (ref 22–32)
Calcium: 8.7 mg/dL — ABNORMAL LOW (ref 8.9–10.3)
Chloride: 109 mmol/L (ref 98–111)
Creatinine, Ser: 0.97 mg/dL (ref 0.44–1.00)
GFR, Estimated: >60 mL/min (ref >60)
Glucose, Bld: 92 mg/dL (ref 70–99)
Potassium: 3.5 mmol/L (ref 3.5–5.1)
Sodium: 135 mmol/L (ref 135–145)
Total Bilirubin: 0.6 mg/dL (ref 0.3–1.2)
Total Protein: 6 g/dL — ABNORMAL LOW (ref 6.5–8.1)

## 2019-11-30 LAB — PHENYTOIN LEVEL, FREE AND TOTAL
Phenytoin, Free: 0.6 ug/mL — ABNORMAL LOW (ref 1.0–2.0)
Phenytoin, Total: 4.8 ug/mL — ABNORMAL LOW (ref 10.0–20.0)

## 2019-11-30 LAB — HEPARIN LEVEL (UNFRACTIONATED)
Heparin Unfractionated: 0.85 IU/mL — ABNORMAL HIGH (ref 0.30–0.70)
Heparin Unfractionated: 0.53 IU/mL (ref 0.30–0.70)
Heparin Unfractionated: 0.24 IU/mL — ABNORMAL LOW (ref 0.30–0.70)

## 2019-11-30 LAB — PROCALCITONIN: Procalcitonin: 24.12 ng/mL

## 2019-11-30 LAB — IRON AND TIBC
Iron: 58 ug/dL (ref 28–170)
Saturation Ratios: 37 % — ABNORMAL HIGH (ref 10.4–31.8)
TIBC: 158 ug/dL — ABNORMAL LOW (ref 250–450)
UIBC: 100 ug/dL

## 2019-11-30 LAB — FERRITIN: Ferritin: 432 ng/mL — ABNORMAL HIGH (ref 11–307)

## 2019-11-30 LAB — URINE CULTURE: Culture: >=100000 — AB

## 2019-11-30 LAB — TSH: TSH: 1.555 u[IU]/mL (ref 0.350–4.500)

## 2019-11-30 LAB — FOLATE: Folate: 8.6 ng/mL (ref >5.9)

## 2019-11-30 LAB — VITAMIN B12: Vitamin B-12: 573 pg/mL (ref 180–914)

## 2019-11-30 LAB — MAGNESIUM: Magnesium: 1.4 mg/dL — ABNORMAL LOW (ref 1.7–2.4)

## 2019-11-30 LAB — CALCIUM
Calcium: 8.7 mg/dL — ABNORMAL LOW (ref 8.9–10.3)
Calcium: 8.2 mg/dL — ABNORMAL LOW (ref 8.9–10.3)

## 2019-11-30 LAB — PHOSPHORUS: Phosphorus: 2.8 mg/dL (ref 2.5–4.6)

## 2019-11-30 LAB — PARATHYROID HORMONE, INTACT (NO CA): PTH: 83 pg/mL — ABNORMAL HIGH (ref 15–65)

## 2019-11-30 MED ORDER — HEPARIN BOLUS VIA INFUSION
1200.0000 [IU] | Freq: Once | INTRAVENOUS | Status: AC
  Administered 2019-11-30: 1200 [IU] via INTRAVENOUS
  Filled 2019-11-30: qty 1200

## 2019-11-30 MED ORDER — CINACALCET HCL 30 MG PO TABS
30.0000 mg | ORAL_TABLET | Freq: Two times a day (BID) | ORAL | Status: DC
  Administered 2019-11-30 – 2019-12-03 (×8): 30 mg via ORAL
  Filled 2019-11-30 (×9): qty 1

## 2019-11-30 MED ORDER — MIDODRINE HCL 5 MG PO TABS
10.0000 mg | ORAL_TABLET | Freq: Three times a day (TID) | ORAL | Status: DC
  Administered 2019-11-30 – 2019-12-03 (×11): 10 mg via ORAL
  Filled 2019-11-30 (×11): qty 2

## 2019-11-30 MED ORDER — POTASSIUM CHLORIDE 20 MEQ PO PACK
40.0000 meq | PACK | Freq: Once | ORAL | Status: AC
  Administered 2019-11-30: 40 meq via ORAL
  Filled 2019-11-30: qty 2

## 2019-11-30 MED ORDER — MAGNESIUM SULFATE 2 GM/50ML IV SOLN
2.0000 g | Freq: Once | INTRAVENOUS | Status: AC
  Administered 2019-11-30: 2 g via INTRAVENOUS
  Filled 2019-11-30: qty 50

## 2019-11-30 MED ORDER — MAGNESIUM SULFATE 4 GM/100ML IV SOLN: 4.0000 g | Freq: Once | INTRAVENOUS | Status: DC

## 2019-11-30 MED ORDER — PHENYTOIN SODIUM EXTENDED 100 MG PO CAPS
100.0000 mg | ORAL_CAPSULE | Freq: Every day | ORAL | Status: DC
  Administered 2019-11-30 – 2019-12-02 (×3): 100 mg via ORAL
  Filled 2019-11-30 (×5): qty 1

## 2019-11-30 MED ORDER — TRAZODONE HCL 50 MG PO TABS
50.0000 mg | ORAL_TABLET | Freq: Every evening | ORAL | Status: DC | PRN
  Administered 2019-11-30: 50 mg via ORAL
  Filled 2019-11-30 (×2): qty 1

## 2019-11-30 MED ORDER — PHENYTOIN SODIUM EXTENDED 100 MG PO CAPS
200.0000 mg | ORAL_CAPSULE | Freq: Every day | ORAL | Status: DC
  Administered 2019-12-01 – 2019-12-03 (×3): 200 mg via ORAL
  Filled 2019-11-30 (×3): qty 2

## 2019-11-30 MED ORDER — AMOXICILLIN-POT CLAVULANATE 250-125 MG PO TABS
1.0000 | ORAL_TABLET | Freq: Three times a day (TID) | ORAL | Status: AC
  Administered 2019-11-30: 1 via ORAL
  Filled 2019-11-30 (×2): qty 1

## 2019-11-30 MED ORDER — PHENOBARBITAL 32.4 MG PO TABS
64.8000 mg | ORAL_TABLET | Freq: Two times a day (BID) | ORAL | Status: DC
  Administered 2019-11-30 – 2019-12-03 (×6): 64.8 mg via ORAL
  Filled 2019-11-30 (×6): qty 2

## 2019-11-30 MED ORDER — AMOXICILLIN-POT CLAVULANATE 875-125 MG PO TABS
1.0000 | ORAL_TABLET | Freq: Two times a day (BID) | ORAL | Status: DC
  Administered 2019-11-30 – 2019-12-03 (×6): 1 via ORAL
  Filled 2019-11-30 (×8): qty 1

## 2019-11-30 NOTE — Progress Notes (Signed)
OT Cancellation Note  Patient Details Name: Shannon Kelley MRN: [id number] DOB: Nov 21, 1945   Cancelled Treatment:    Reason Eval/Treat Not Completed: Medical issues which prohibited therapy. Per chart review, patient transferred to CCU due to decline in medical status.  Per guidelines, will require new OT orders to resume services after change in status and transfer to higher level of care.  Pleae re-consult as medically appropriate.  Richrd Prime, MPH, MS, OTR/L ascom [phone] 11/30/19, 7:53 AM

## 2019-11-30 NOTE — Progress Notes (Signed)
ANTICOAGULATION CONSULT NOTE - Consult  Pharmacy Consult for Heparin Indication: VTE treatment  No Known Allergies  Patient Measurements: Height: 5\' 10"  (177.8 cm) Weight: 82.3 kg (181 lb 7 oz) IBW/kg (Calculated) : 68.5 HEPARIN DW (KG): 82.3  Vital Signs: Temp: 98.2 F (36.8 C) (11/12 0000) Temp Source: Oral (11/12 0000) BP: 100/48 (11/12 0000) Pulse Rate: 92 (11/12 0000)  Labs: Recent Labs    11/28/19 1836 11/28/19 1836 11/28/19 2322 11/29/19 0320 11/29/19 0810 11/29/19 1546 11/30/19 0148  HGB 11.6*   < >  --   --  8.7*  --  8.0*  HCT 35.8*  --   --   --  25.2*  --  23.9*  PLT 367  --   --   --  242  --  207  APTT <24*  --   --   --   --   --   --   LABPROT 16.7*  --   --   --   --   --   --   INR 1.4*  --   --   --   --   --   --   HEPARINUNFRC  --   --   --   --  0.54 0.76* 0.85*  CREATININE 1.45*  --   --  1.49*  --   --  0.97  CKTOTAL  --   --  79  --   --   --   --    < > = values in this interval not displayed.   Estimated Creatinine Clearance: 59.4 mL/min (by C-G formula based on SCr of 0.97 mg/dL).  Medical History: Past Medical History:  Diagnosis Date  . Epilepsy (HCC)     Medications:  Medications Prior to Admission  Medication Sig Dispense Refill Last Dose  . acetaminophen (TYLENOL) 325 MG tablet Take 2 tablets by mouth every 4 (four) hours as needed.   PRN at PRN  . Cholecalciferol 25 MCG (1000 UT) capsule Take 2 capsules by mouth at bedtime.   11/27/2019 at 2000  . cinacalcet (SENSIPAR) 30 MG tablet Take 30 mg by mouth 2 (two) times daily with a meal.   11/27/2019 at 1800  . lidocaine (LIDODERM) 5 % Place 2 patches onto the skin daily. Remove & Discard patch within 12 hours or as directed by MD (Apply to lower back every morning)   11/28/2019 at 0900  . magnesium oxide (MAG-OX) 400 MG tablet Take 400 mg by mouth in the morning, at noon, and at bedtime.    11/28/2019 at 1200  . midodrine (PROAMATINE) 10 MG tablet Take 10 mg by mouth 3 (three)  times daily.   11/28/2019 at 1130  . Multiple Vitamins-Minerals (THERA-M) TABS Take 1 tablet by mouth daily.   11/28/2019 at 0900  . PHENobarbital (LUMINAL) 64.8 MG tablet Take 64.8 mg by mouth 2 (two) times daily.   11/28/2019 at 0900  . phenytoin (DILANTIN) 100 MG ER capsule Take 100-200 mg by mouth 2 (two) times daily. Take 2 capsules (200mg ) q day and 1 capsule (100mg ) qhs   11/28/2019 at 0900  . polyethylene glycol (MIRALAX / GLYCOLAX) 17 g packet Take 17 g by mouth daily as needed.   prn at prn  . warfarin (COUMADIN) 5 MG tablet Take 7 mg by mouth daily.    11/27/2019 at 1800  . hydrOXYzine (ATARAX/VISTARIL) 25 MG tablet Take 25 mg by mouth every 8 (eight) hours as needed for anxiety. (Patient not taking:  Reported on 11/29/2019)   Not Taking at Unknown time  . hydrOXYzine (VISTARIL) 25 MG capsule Take 1 capsule by mouth every 8 (eight) hours as needed for anxiety. (Patient not taking: Reported on 11/29/2019)   Not Taking at Unknown time  . QUEtiapine (SEROQUEL) 25 MG tablet Take 25 mg by mouth at bedtime.  (Patient not taking: Reported on 11/29/2019)   Not Taking at Unknown  . sertraline (ZOLOFT) 50 MG tablet Take 50 mg by mouth daily. (Patient not taking: Reported on 11/29/2019)   Not Taking at Unknown time    Assessment: H/H downtrended, PLTs 193>790.  Initiating Heparin for VTE treatment.  Pt transitioned from subtherapeutic warfarin to Heparin infusion while inpatient for treatment of pre-existing non-acute PE visualized on imaging. Heparin level was therapeutic x1 at 0.54 on 11/11 0810.   Baseline INR = 1.4, aPTT = < 24. Date TIME HL Dosing Rate 11/11 0810 0.54 1200 units/hr 11/11 1546 0.76  11/12 0148 0.85 1100 units/hr  Goal of Therapy:  Heparin level 0.3-0.7 units/ml Monitor platelets by anticoagulation protocol: Yes   Plan:  11/12 @ 0148 HL 0.85. Level is supratherapeutic. Will reduce  infusion rate to 900 units/hr Recheck HL in 8 hours  Continue to monitor CBC daily per  protocol  Wayland Denis, PharmD Clinical Pharmacist 11/30/2019 3:05 AM

## 2019-11-30 NOTE — Progress Notes (Signed)
SLP Cancellation Note  Patient Details Name: Shannon Kelley MRN: [id number] DOB: Nov 13, 1945   Cancelled treatment:       Reason Eval/Treat Not Completed: SLP screened, no needs identified, will sign off  Despite transfer to CCU, pt continues to be appropriate to baseline prescribed diet of dysphagia 3. Per MD, may discharge ST orders.   Terril Amaro B. Dreama Saa M.S., CCC-SLP, [facility] Speech-Language Pathologist Rehabilitation Services Office [phone]   Reuel Derby 11/30/2019, 11:48 AM

## 2019-11-30 NOTE — Progress Notes (Signed)
ANTICOAGULATION CONSULT NOTE  Pharmacy Consult for Heparin Indication: VTE treatment  No Known Allergies  Patient Measurements: Height: 5\' 10"  (177.8 cm) Weight: 82.3 kg (181 lb 7 oz) IBW/kg (Calculated) : 68.5 HEPARIN DW (KG): 82.3  Vital Signs: Temp: 98.5 F (36.9 C) (11/12 2000) Temp Source: Oral (11/12 2000) BP: 111/41 (11/12 2000) Pulse Rate: 63 (11/12 2000)  Labs: Recent Labs    11/28/19 1836 11/28/19 1836 11/28/19 2322 11/29/19 0320 11/29/19 0810 11/29/19 1546 11/30/19 0148 11/30/19 1241 11/30/19 2050  HGB 11.6*   < >  --   --  8.7*  --  8.0* 8.7*  --   HCT 35.8*   < >  --   --  25.2*  --  23.9* 24.8*  --   PLT 367   < >  --   --  242  --  207 215  --   APTT <24*  --   --   --   --   --   --   --   --   LABPROT 16.7*  --   --   --   --   --   --   --   --   INR 1.4*  --   --   --   --   --   --   --   --   HEPARINUNFRC  --   --   --   --  0.54   < > 0.85* 0.53 0.24*  CREATININE 1.45*  --   --  1.49*  --   --  0.97  --   --   CKTOTAL  --   --  79  --   --   --   --   --   --    < > = values in this interval not displayed.   Estimated Creatinine Clearance: 59.4 mL/min (by C-G formula based on SCr of 0.97 mg/dL).  Medical History: Past Medical History:  Diagnosis Date  . Epilepsy (HCC)      Assessment: H/H downtrended, PLTs 13/12/21.  Initiating Heparin for VTE treatment.  Pt transitioned from subtherapeutic warfarin to Heparin infusion while inpatient for treatment of pre-existing non-acute PE visualized on imaging. Heparin level was therapeutic x1 at 0.54 on 11/11 0810.   Baseline INR = 1.4, aPTT = < 24. Date TIME HL Dosing Rate 11/11 0810 0.54 1200 units/hr 11/11 1546 0.76  11/12 0148 0.85 1100 units/hr 11/12 1241 0.53 900 units/hr 11/13 2050 0.24 ----   Goal of Therapy:  Heparin level 0.3-0.7 units/ml Monitor platelets by anticoagulation protocol: Yes   Plan:  Heparin level SUBtherapeutic. Per Tracie, no interruptions in heparin or issues  with line. Heparin bolus 1200 units x1 ,followed by increase heparin infusion to 1050 units/hr. Nursing made aware of changes. Will recheck HL in 8 hours (0630) and CBC daily while on heparin drip.  12/13, PharmD Clinical Pharmacist 11/30/2019 9:48 PM

## 2019-11-30 NOTE — Progress Notes (Signed)
PT Cancellation Note  Patient Details Name: Shannon Kelley MRN: [id number] DOB: [birth year]   Cancelled Treatment:    Reason Eval/Treat Not Completed: Medical issues which prohibited therapy (Per chart review, patient transferred to CCU due to decline in medical status.  Per guidelines, will require new PT orders to resume services after change in status and transfer to higher level of care.  Pleae re-consult as medically appropriate.)   Nattie Lazenby H. Manson Passey, PT, DPT, NCS 11/30/19, 7:33 AM [phone]

## 2019-11-30 NOTE — Progress Notes (Signed)
PHARMACY CONSULT NOTE - FOLLOW UP  Pharmacy Consult for Electrolyte Monitoring and Replacement   Recent Labs: Potassium (mmol/L)  Date Value  11/30/2019 3.5   Magnesium (mg/dL)  Date Value  34/74/2595 1.4 (L)   Calcium (mg/dL)  Date Value  63/87/5643 8.7 (L)   Albumin (g/dL)  Date Value  32/95/1884 2.4 (L)   Phosphorus (mg/dL)  Date Value  16/60/6301 2.8   Sodium (mmol/L)  Date Value  11/30/2019 135   Assessment: Mg = 1.4, K = 3.5, Ca = 11.1 > 10.4 > 10.0 > 9.1 > 8.7 and pt has order for calcitonin, has received 2 doses  Goal of Therapy:  Mg WNL K >/= 4.0  Plan:  Magnesium 2gm IV x 1 KCL po x 1 F/U labs in am   Wayland Denis ,PharmD Clinical Pharmacist 11/30/2019 3:16 AM

## 2019-11-30 NOTE — Progress Notes (Signed)
Updated Lyman Speller at [phone] and answered all questions at this time

## 2019-11-30 NOTE — Evaluation (Signed)
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 Pt presented d/t shortness of breath and left lower extremity pain. Pt found to have sepsis and acute DVT on doppler and has been recieveing heparin.   Clinical Impression   Pt seen for OT evaluation this date in setting of acute hospitalization with sepsis and DVT. Pt is somewhat confused, but some PLOF information provided from pt aligns with chart review. Pt endorses presenting from [facility] and having required assist with bathing/dressing and IADLs in the previous setting, but reports she was able to feed and groom herself. Pt states she was able to walk with walker at home prior and lived with her husband. Pt requires MOD A For bed mobility, MIN A for UB ADLs and MAX A for LB ADLs at this time. Pt demos P static sitting balance. Will continue to follow. Anticipate pt will require SNF upon d/c from acute setting.     Follow Up Recommendations  SNF    Equipment Recommendations  Other (comment) (defer to next level of care)    Recommendations for Other Services       Precautions / Restrictions Precautions Precautions: Fall Restrictions Weight Bearing Restrictions: No      Mobility Bed Mobility Overal bed mobility: Needs Assistance Bed Mobility: Supine to Sit;Sit to Supine     Supine to sit: Mod assist Sit to supine: Mod assist        Transfers                 General transfer comment: NT    Balance Overall balance assessment: Needs assistance   Sitting balance-Leahy Scale: Poor Sitting balance - Comments: able to sustain static sitting with UE support in short bouts, but fatigues easily requiring MIN A  from OT to sustain, especially when she is needing to alternate hands to perform a fxl task.       Standing balance comment: NT                           ADL either performed or assessed with clinical judgement   ADL                                         General ADL Comments: MIN A for seated UB ADLs, MAX A for seated LB ADLs. MOD A with HOB elevated for sup<>sit     Vision Patient Visual Report: No change from baseline       Perception     Praxis      Pertinent Vitals/Pain Pain Assessment: 0-10 Pain Score: 6  Pain Location: L LE with mobilization Pain Descriptors / Indicators: Aching;Tender Pain Intervention(s): Limited activity within patient's tolerance;Monitored during session     Hand Dominance Left   Extremity/Trunk Assessment Upper Extremity Assessment Upper Extremity Assessment: Generalized weakness (MMT of L UE slightly weaker than R UE, but overall ROM same bilaterally.)   Lower Extremity Assessment Lower Extremity Assessment: Generalized weakness (L LE noted to have limited knee/ankle ROM relative to R LE, pt reports has been this way and has been a problem for some time now.)  Communication Communication Communication: No difficulties   Cognition Arousal/Alertness: Awake/alert Behavior During Therapy: WFL for tasks assessed/performed Overall Cognitive Status: No family/caregiver present to determine baseline cognitive functioning                                 General Comments: Pt is oriented to location, self, and some aspects of time-month/year. Pt is able to follow simple one step commands appropriately. Does perseverate on feeling cold.   General Comments  drop to 88% with sup<>sit transition on the 2L, but recovers within ~20 seconds of seated rest back to >90%. Pt mostly in mid-90s throughout session on just the 2L.    Exercises Other Exercises Other Exercises: OT facilitates ed re: importance  of OOB Activity, safety considerations/fall prevention and role of OT. Pt with moderate understanding/reception. Requries gentle re-direction as she perseverates on being cold despite 3 blankets and heat in room on highest setting.   Shoulder Instructions      Home Living Family/patient expects to be discharged to:: Skilled nursing facility                                 Additional Comments: Pt reports before [facility], she was at home with her husband and used a walker, but endorses several falls. Pt is somewhat questionable historian, but states she believes she was at [facility] at least over a month and that appears to be true based on chart review.      Prior Functioning/Environment Level of Independence: Needs assistance  Gait / Transfers Assistance Needed: Pt reports needing assist with transfers and was using walker with therapy at [facility], before getting a clot. ADL's / Homemaking Assistance Needed: Reports she is able feed and groom herself but was requiring assist to bathe and dress at baseline.   Comments: endorses that fxl mobility was getting progressively worse before rehab d/t L LE pain        OT Problem List: Decreased strength;Decreased range of motion;Decreased activity tolerance;Impaired balance (sitting and/or standing);Decreased knowledge of use of DME or AE;Cardiopulmonary status limiting activity;Pain      OT Treatment/Interventions: Self-care/ADL training;DME and/or AE instruction;Therapeutic activities;Balance training;Therapeutic exercise;Energy conservation;Patient/family education    OT Goals(Current goals can be found in the care plan section) Acute Rehab OT Goals Patient Stated Goal: to get stronger OT Goal Formulation: With patient Time For Goal Achievement: 12/14/19 Potential to Achieve Goals: Good ADL Goals Pt Will Perform Grooming: with set-up;sitting (to complete 2-3 g/h tasks to increase sitting fxl activity tolernace.) Pt Will Perform Lower Body  Dressing: with min assist;with mod assist;with adaptive equipment;sit to/from stand Pt Will Transfer to Toilet: with min assist;with mod assist;stand pivot transfer;bedside commode Pt Will Perform Toileting - Clothing Manipulation and hygiene: with mod assist;sit to/from stand Pt/caregiver will Perform Home Exercise Program: Increased strength;Both right and left upper extremity;With Supervision Additional ADL Goal #1: Pt will verbalize/demo 3 EC strategies with 0% verbal cues.  OT Frequency: Min 1X/week   Barriers to D/C:            Co-evaluation              AM-PAC OT "6 Clicks" Daily Activity     Outcome Measure Help from another person eating meals?: None Help from another person taking care of personal grooming?: A Little Help from another person toileting, which includes using toliet, bedpan, or  urinal?: A Lot Help from another person bathing (including washing, rinsing, drying)?: A Lot Help from another person to put on and taking off regular upper body clothing?: A Little Help from another person to put on and taking off regular lower body clothing?: A Lot 6 Click Score: 16   End of Session Equipment Utilized During Treatment: Oxygen (2Lnc) Nurse Communication: Mobility status;Other (comment) (notified RN of O2 status-drop to 88% with sup<>sit transition on the 2L, but recovers within ~20 seconds.)  Activity Tolerance: Patient tolerated treatment well;Patient limited by pain Patient left: in bed;with call bell/phone within reach;with bed alarm set  OT Visit Diagnosis: Unsteadiness on feet (R26.81);Muscle weakness (generalized) (M62.81)                Time: 5465-0354 OT Time Calculation (min): 38 min Charges:  OT General Charges $OT Visit: 1 Visit OT Evaluation $OT Eval Moderate Complexity: 1 Mod OT Treatments $Self Care/Home Management : 8-22 mins $Therapeutic Activity: 8-22 mins  Rejeana Brock, MS, OTR/L ascom [phone] 11/30/19, 10:57 AM

## 2019-11-30 NOTE — Progress Notes (Signed)
NAME:  Shannon Kelley, MRN:  [id number], DOB:  January 18, 1946, LOS: 2 ADMISSION DATE:  11/28/2019, CONSULTATION DATE:  11/29/19 REFERRING MD:  Dr. Jacqulyn Bath, CHIEF COMPLAINT:  Fever  Brief History   74 yo female presenting with suspected urosepsis and septic shock requiring vasopressors admitted to the ICU.  History of present illness   74 year old female presented to the ED via EMS from [facility] with dyspnea, hypotension, fever, tachycardia and hypoxia.  Per ED documentation the staff at [facility] checked her O2 and it was in the 70s so EMS was called. Upon arrival to the ED, EMS reported the patient as febrile at 102, with SpO2 in the 70's requiring a non rebreather and stated the patient had been vomiting at the SNF. 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 ED workup demonstrated labs significant for: Lactic 3.8~5.1~2.4, PCT 26.53~31.92, Ca+11.1~10.4~10, UA + UTI, CTa negative for PE.  Sepsis protocol was initiated, and the patient was transitioned from coumadin to heparin drip for suspected DVT. The patient was admitted to the ICU with PCCM consulted due to hypotension requiring vasopressors.   Events   11/29/19 Admit to ICU   11/12- had OT did well and is speaking with less confusion. Discussed care plan with hospitalist.    Past Medical History  Epilepsy DVT on warfarin Hyperparathyroidism Dementia   Significant Diagnostic Tests:  11/28/19 CTa >> small eccentric filling defects within the LLL segmental pulmonary arteries most consistent with chronic thromboembolic disease. No signs of acute pulmonary emboli. 11/28/19 [place] BLE >> nonocclusive DVT throughout LLE  Micro Data:  11/28/19 COVID- 19 >> negative 11/28/19 Influenza A/B >> negative 11/29/19 MRSA PCR >> positive 11/28/19 BC x 2 >> 11/28/19 UC >>  Antimicrobials:  11/28/19 Cefepime >>  11/28/19 Metronidazole x 1 11/28/19 Vancomycin >>  Interim history/subjective:    Patient lying in bed resting, roused easily, reports that her LLE had been hurting and "feeling weird". We discussed the treatment she is receiving for the DVT in her LLE as well as the antibiotics for the suspected UTI.  She reported taking coumadin at home due to previous DVT's dating back to the 1980's. Patient reported being comfortable at this time, just tired.  Labs/ Imaging personally reviewed Na+/ K+: 135/ 3.6 BUN/Cr.: 22/ 1.49 Hgb: 8.7  WBC/ TMAX: 13/ 103.4 Lactic/ PCT: 5.1~2.4~1.5/ 26.53~31.92  CXR 11/28/19: no active disease  Objective   Blood pressure (!) 109/54, pulse 84, temperature 98.8 F (37.1 C), temperature source Oral, resp. rate 19, height 5\' 10"  (1.778 m), weight 82.3 kg, SpO2 100 %.        Intake/Output Summary (Last 24 hours) at 11/30/2019 0753 Last data filed at 11/30/2019 0600 Gross per 24 hour  Intake 4614.41 ml  Output 2250 ml  Net 2364.41 ml   Filed Weights   11/28/19 1837 11/29/19 1751  Weight: 74.8 kg 82.3 kg    Examination: General: Adult female, critically ill, lying in bed, NAD HEENT: MM pink/dry, anicteric, atraumatic, neck supple Neuro: A&O x 3 with some delayed responses, follows commands, PERRL +3, MAE with generalized weakness CV: s1s2 RRR, NSR on monitor, no r/m/g Pulm: Regular, non labored on room air, breath sounds clear/diminished throughout GI: soft, rounded, non tender, bs x 4 GU: purewick in place with clear yellow urine noted Skin: excoriation noted on BLE Extremities: warm/dry, pulses + 2 R/P, trace edema noted  Resolved Hospital Problem list     Assessment & Plan:  Sepsis with septic shock due to suspected urinary tract infection Upon arrival to the ED, EMS reported the patient as febrile at 102, with SpO2 in the 70's requiring a non-rebreather. UA + for UTI, patient hypotensive requiring peripheral low dose levophed.  - continue IVF resuscitation - continue Levophed to maintain MAP > 65 - continue Cefepime &  vancomycin per pharmacy consult - follow BC & UC - monitor WBC & fever curve - trend PCT - continuous cardiac monitoring -  Deep Vein Thrombosis in LLE PMHx: DVT on warfarin Patient reports that her LLE had been hurting and "feeling weird". US BLE positive for LLE DVT. Cta negative for PE. - continue heparin drip per pharmacy for DVT - Monitor for s/s of bleeding - monitor CBC, PT/INR  Acute Hypoxic Respiratory Failure            Due to Bilateral atelectasis worse at bases with mild inetstitial pulmonary edema.  High risk for aspiration due to multiple mood stabalizing psychiatric medications as well as AEDs (phenobarbitol and Dilantin).   No PMHx of lung disease. Patient dyspneic at SNF, SpO2 in 70's per EMS, currently stable on 4L [place]. CXR clear, Cta negative for PE - continuous SpO2 monitoring - supplemental O2 PRN to maintain SpO2 >90%        -chest PT - discussed with RT Arman FilterLarry Sloat - Bed CPT  Epilepsy - continue home medications: phenobarbital & phenytoin - seizure and aspiration precautions  Best practice:  Diet: DYS 3, thin liquid Pain/Anxiety/Delirium protocol (if indicated): N/A VAP protocol (if indicated): N/A DVT prophylaxis: Heparin drip GI prophylaxis: N/A Glucose control: monitor Q 4 Mobility: bedrest, mobilize as tolerated Code Status: Partial- no CPR/intubation; administer ACLS meds/ Bipap/ defibrillation/ cardioversion Family Communication: patient updated bedside, daughter will be updated by day team Disposition: ICU  Labs   CBC: Recent Labs  Lab 11/28/19 1836 11/29/19 0810 11/30/19 0148  WBC 8.2 13.0* 8.3  NEUTROABS 7.7 11.9*  --   HGB 11.6* 8.7* 8.0*  HCT 35.8* 25.2* 23.9*  MCV 105.6* 100.4* 102.6*  PLT 367 242 207    Basic Metabolic Panel: Recent Labs  Lab 11/28/19 1836 11/28/19 1836 11/28/19 2322 11/29/19 0320 11/29/19 0810 11/29/19 1546 11/30/19 0148  NA 137  --   --  135  --   --  135  K 4.7  --   --  3.6  --   --  3.5  CL 101   --   --  106  --   --  109  CO2 19*  --   --  20*  --   --  20*  GLUCOSE 94  --   --  149*  --   --  92  BUN 24*  --   --  22  --   --  21  CREATININE 1.45*  --   --  1.49*  --   --  0.97  CALCIUM 12.2*   < > 11.1* 10.4* 10.0 9.1 8.7*  MG  --   --  1.4* 2.1  --   --  1.4*  PHOS  --   --  <1.0* 1.2*  --   --  2.8   < > = values in this interval not displayed.   GFR: Estimated Creatinine Clearance: 59.4 mL/min (by C-G formula based on SCr of 0.97 mg/dL). Recent Labs  Lab 11/28/19 1836 11/28/19 2054 11/28/19 2322 11/29/19 0320 11/29/19 0810 11/29/19 1546 11/29/19 1840 11/30/19 0148  PROCALCITON  --   --  26.53  --   --  31.92  --  24.12  WBC 8.2  --   --   --  13.0*  --   --  8.3  LATICACIDVEN 5.7*   < > 5.1* 2.4*  --  1.0 1.5  --    < > = values in this interval not displayed.    Liver Function Tests: Recent Labs  Lab 11/28/19 1836 11/29/19 0320 11/30/19 0148  AST 87* 53* 40  ALT 43 30 25  ALKPHOS 145* 104 88  BILITOT 0.8 1.1 0.6  PROT 8.1 6.3* 6.0*  ALBUMIN 3.7 2.7* 2.4*   No results for input(s): LIPASE, AMYLASE in the last 168 hours. No results for input(s): AMMONIA in the last 168 hours.  ABG    Component Value Date/Time   HCO3 23.5 11/28/2019 2204   O2SAT 17.3 11/28/2019 2204     Coagulation Profile: Recent Labs  Lab 11/28/19 1836  INR 1.4*    Cardiac Enzymes: Recent Labs  Lab 11/28/19 2322  CKTOTAL 79    HbA1C: No results found for: HGBA1C  CBG: Recent Labs  Lab 11/29/19 2339  GLUCAP 99    Review of Systems: Positives in BOLD  Gen: Denies fever, chills, weight change, fatigue, night sweats HEENT: Denies blurred vision, double vision, hearing loss, tinnitus, sinus congestion, rhinorrhea, sore throat, neck stiffness, dysphagia PULM: Denies shortness of breath, cough, sputum production, hemoptysis, wheezing CV: Denies chest pain, edema, orthopnea, paroxysmal nocturnal dyspnea, palpitations GI: Denies abdominal pain, nausea, vomiting,  diarrhea, hematochezia, melena, constipation, change in bowel habits GU: Denies dysuria, hematuria, polyuria, oliguria, urethral discharge Endocrine: Denies hot or cold intolerance, polyuria, polyphagia or appetite change Derm: Denies rash, dry skin, scaling or peeling skin change Heme: Denies easy bruising, bleeding, bleeding gums, pain in LLE with exertion Neuro: Denies headache, numbness, weakness, slurred speech, loss of memory or consciousness  Past Medical History  She,  has a past medical history of Epilepsy (HCC).   Surgical History    Past Surgical History:  Procedure Laterality Date  . FRACTURE SURGERY       Social History   reports that she has quit smoking. She has never used smokeless tobacco. She reports previous alcohol use. She reports previous drug use.   Family History   Her family history includes Diabetes in her father and another family member.   Allergies No Known Allergies   Home Medications  Prior to Admission medications   Medication Sig Start Date End Date Taking? Authorizing Provider  acetaminophen (TYLENOL) 325 MG tablet Take 2 tablets by mouth every 4 (four) hours as needed. 10/24/19  Yes [provider]  Cholecalciferol 25 MCG (1000 UT) capsule Take 2 capsules by mouth at bedtime. 10/24/19  Yes [provider]  cinacalcet (SENSIPAR) 30 MG tablet Take 30 mg by mouth 2 (two) times daily with a meal.   Yes [provider]  lidocaine (LIDODERM) 5 % Place 2 patches onto the skin daily. Remove & Discard patch within 12 hours or as directed by MD (Apply to lower back every morning)   Yes [provider]  magnesium oxide (MAG-OX) 400 MG tablet Take 400 mg by mouth in the morning, at noon, and at bedtime.    Yes [provider]  midodrine (PROAMATINE) 10 MG tablet Take 10 mg by mouth 3 (three) times daily.   Yes [provider]  Multiple Vitamins-Minerals (THERA-M) TABS Take 1 tablet by mouth daily. 06/08/19   Yes [provider]  PHENobarbital (LUMINAL) 64.8 MG tablet Take 64.8 mg by mouth 2 (two) times daily.   Yes [provider]  phenytoin (DILANTIN) 100 MG ER capsule Take 100-200 mg by mouth 2 (two) times daily. Take 2 capsules (200mg ) q day and 1 capsule (100mg ) qhs   Yes [provider]  polyethylene glycol (MIRALAX / GLYCOLAX) 17 g packet Take 17 g by mouth daily as needed.   Yes [provider]  warfarin (COUMADIN) 5 MG tablet Take 7 mg by mouth daily.    Yes [provider]  hydrOXYzine (ATARAX/VISTARIL) 25 MG tablet Take 25 mg by mouth every 8 (eight) hours as needed for anxiety. Patient not taking: Reported on 11/29/2019    [provider]  hydrOXYzine (VISTARIL) 25 MG capsule Take 1 capsule by mouth every 8 (eight) hours as needed for anxiety. Patient not taking: Reported on 11/29/2019 10/24/19   [provider]  QUEtiapine (SEROQUEL) 25 MG tablet Take 25 mg by mouth at bedtime.  Patient not taking: Reported on 11/29/2019    [provider]  sertraline (ZOLOFT) 50 MG tablet Take 50 mg by mouth daily. Patient not taking: Reported on 11/29/2019    [provider]        Critical care time: 35 minutes      Critical care provider statement:    Critical care time (minutes):  33   Critical care time was exclusive of:  Separately billable procedures and  treating other patients   Critical care was necessary to treat or prevent imminent or  life-threatening deterioration of the following conditions:  Altered mental status with encephalopathy, sepsis , hypotension with septic shock.    Critical care was time spent personally by me on the following  activities:  Development of treatment plan with patient or surrogate,  discussions with consultants, evaluation of patient's response to  treatment, examination of patient, obtaining history from patient or  surrogate, ordering and performing treatments and  interventions, ordering  and review of laboratory studies and re-evaluation of patient's condition   I assumed direction of critical care for this patient from another  provider in my specialty: no      13/11/2019, M.D.  Pulmonary & Critical Care Medicine  [facility]

## 2019-11-30 NOTE — Evaluation (Signed)
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 Pt presented d/t shortness of breath and left lower extremity pain. Pt found to have sepsis and acute DVT on doppler and has been recieveing heparin.  MD assessment also includes: Acute hypoxemic respiratory failure, Acute metabolic encephalopathy, macrocytic anemia, AKI, Hypomagnesemia, Hypercalcemia, Prolonged QT interval, and mild LFT elevation.    Clinical Impression  Pt was pleasant and put forth good effort during the session but was limited by anxiety/agitation at time requiring frequent breaks for relaxation breathing.  Pt was able to perform bed mobility task with only min A, stand from an elevated EOB with min-modA, and then take several steps with a RW with CGA and fair stability. Pt provided with +2 assist for safety only but ultimately did not require it.  Pt's SpO2 on 4LO2/min occasionally dropped to the mid 80's when she was anxious and taking short rapid breaths through her mouth but quickly would increase back into the 90s with PLB cues.  Pt will benefit from PT services in a SNF setting upon discharge to safely address deficits listed in patient problem list for decreased caregiver assistance and eventual return to PLOF.      Follow Up Recommendations SNF;Supervision/Assistance - 24 hour    Equipment Recommendations  Other (comment) (TBD at next venue of care)    Recommendations for Other Services       Precautions / Restrictions Precautions Precautions: Fall Restrictions Weight Bearing Restrictions: No Other Position/Activity Restrictions: Seizure disorder      Mobility  Bed Mobility Overal bed  mobility: Needs Assistance Bed Mobility: Supine to Sit;Sit to Supine     Supine to sit: Min assist Sit to supine: Min assist   General bed mobility comments: Min A for LLE management    Transfers Overall transfer level: Needs assistance Equipment used: Rolling walker (2 wheeled) Transfers: Sit to/from Stand Sit to Stand: Min assist;Mod assist;From elevated surface;+2 safety/equipment         General transfer comment: Mod verbal cues for sequencing  Ambulation/Gait Ambulation/Gait assistance: +2 safety/equipment, CGA Gait Distance (Feet): 6 Feet Assistive device: Rolling walker (2 wheeled) Gait Pattern/deviations: Step-through pattern;Decreased step length - right;Decreased step length - left;Trunk flexed Gait velocity: decreased   General Gait Details: Pt able to take several small steps at the EOB without physical assistance with fair stability  Stairs            Wheelchair Mobility    Modified Rankin (Stroke Patients Only)       Balance Overall balance assessment: Needs assistance   Sitting balance-Leahy Scale: Fair Sitting balance - Comments: Pt anxious in sitting with occasional rocking forward/backward but seemed like they were voluntary movements with pt able to self correct.   Standing balance support: Bilateral upper extremity supported;During functional activity Standing balance-Leahy Scale: Fair Standing balance comment: Mod lean on the RW for support                             Pertinent Vitals/Pain Pain Assessment: No/denies pain Pain Location: 0/10 pain at rest with min to mod LLE pain with movement Pain Intervention(s): Monitored during session;Repositioned    Home Living Family/patient expects to be discharged  to:: Skilled nursing facility                 Additional Comments: Pt reports admisson to acute care from SNF ([facility]); reports use of RW at baseline and several recent falls. Pt is somewhat questionable historian.   Plan is to return to SNF following hospitalization.    Prior Function Level of Independence: Needs assistance   Gait / Transfers Assistance Needed: Pt reports needing assist with transfers and was using walker with therapy at [facility]  ADL's / Homemaking Assistance Needed: Reports she is able feed and groom herself but was requiring assist to bathe and dress at baseline.        Hand Dominance        Extremity/Trunk Assessment   Upper Extremity Assessment Upper Extremity Assessment: Generalized weakness    Lower Extremity Assessment Lower Extremity Assessment: Generalized weakness       Communication   Communication: No difficulties  Cognition Arousal/Alertness: Awake/alert Behavior During Therapy: Anxious Overall Cognitive Status: No family/caregiver present to determine baseline cognitive functioning                                        General Comments      Exercises Total Joint Exercises Gluteal Sets: Strengthening;Both;10 reps Hip ABduction/ADduction: AAROM;Both;10 reps Straight Leg Raises: AAROM;Both;10 reps Long Arc Quad: AROM;Strengthening;Both;10 reps Knee Flexion: AROM;Strengthening;Both;10 reps Other Exercises Other Exercises: Static sitting at the EOB with cues for upright sitting/postural control Other Exercises: Pt education provided for PLB for increased SpO2 and relaxation/anxiety control   Assessment/Plan    PT Assessment Patient needs continued PT services  PT Problem List Decreased strength;Decreased activity tolerance;Decreased balance;Decreased mobility;Decreased knowledge of use of DME       PT Treatment Interventions DME instruction;Gait training;Functional mobility training;Therapeutic activities;Therapeutic exercise;Balance training;Patient/family education    PT Goals (Current goals can be found in the Care Plan section)  Acute Rehab PT Goals Patient Stated Goal: To walk again PT Goal Formulation: With patient Time  For Goal Achievement: 12/13/19 Potential to Achieve Goals: Good    Frequency Min 2X/week   Barriers to discharge Inaccessible home environment      Co-evaluation               AM-PAC PT "6 Clicks" Mobility  Outcome Measure Help needed turning from your back to your side while in a flat bed without using bedrails?: A Little Help needed moving from lying on your back to sitting on the side of a flat bed without using bedrails?: A Little Help needed moving to and from a bed to a chair (including a wheelchair)?: A Little Help needed standing up from a chair using your arms (e.g., wheelchair or bedside chair)?: A Little Help needed to walk in hospital room?: A Lot Help needed climbing 3-5 steps with a railing? : Total 6 Click Score: 15    End of Session Equipment Utilized During Treatment: Gait belt;Oxygen Activity Tolerance: Patient tolerated treatment well Patient left: in bed;with bed alarm set;with call bell/phone within reach;with nursing/sitter in room Nurse Communication: Mobility status PT Visit Diagnosis: Unsteadiness on feet (R26.81);Muscle weakness (generalized) (M62.81);Difficulty in walking, not elsewhere classified (R26.2)    Time: 8588-5027 PT Time Calculation (min) (ACUTE ONLY): 34 min   Charges:   PT Evaluation $PT Eval Moderate Complexity: 1 Mod PT Treatments $Therapeutic Exercise: 8-22 mins        D.  Elly Modena PT, DPT 11/30/19, 4:50 PM

## 2019-11-30 NOTE — Progress Notes (Signed)
ANTICOAGULATION CONSULT NOTE  Pharmacy Consult for Heparin Indication: VTE treatment  No Known Allergies  Patient Measurements: Height: 5\' 10"  (177.8 cm) Weight: 82.3 kg (181 lb 7 oz) IBW/kg (Calculated) : 68.5 HEPARIN DW (KG): 82.3  Vital Signs: Temp: 98.5 F (36.9 C) (11/12 1200) Temp Source: Oral (11/12 0700) BP: 113/62 (11/12 1200) Pulse Rate: 91 (11/12 1200)  Labs: Recent Labs    11/28/19 1836 11/28/19 1836 11/28/19 2322 11/29/19 0320 11/29/19 0810 11/29/19 0810 11/29/19 1546 11/30/19 0148 11/30/19 1241  HGB 11.6*   < >  --   --  8.7*   < >  --  8.0* 8.7*  HCT 35.8*   < >  --   --  25.2*  --   --  23.9* 24.8*  PLT 367   < >  --   --  242  --   --  207 215  APTT <24*  --   --   --   --   --   --   --   --   LABPROT 16.7*  --   --   --   --   --   --   --   --   INR 1.4*  --   --   --   --   --   --   --   --   HEPARINUNFRC  --   --   --   --  0.54   < > 0.76* 0.85* 0.53  CREATININE 1.45*  --   --  1.49*  --   --   --  0.97  --   CKTOTAL  --   --  79  --   --   --   --   --   --    < > = values in this interval not displayed.   Estimated Creatinine Clearance: 59.4 mL/min (by C-G formula based on SCr of 0.97 mg/dL).  Medical History: Past Medical History:  Diagnosis Date  . Epilepsy (HCC)      Assessment: H/H downtrended, PLTs 13/12/21.  Initiating Heparin for VTE treatment.  Pt transitioned from subtherapeutic warfarin to Heparin infusion while inpatient for treatment of pre-existing non-acute PE visualized on imaging. Heparin level was therapeutic x1 at 0.54 on 11/11 0810.   Baseline INR = 1.4, aPTT = < 24. Date TIME HL Dosing Rate 11/11 0810 0.54 1200 units/hr 11/11 1546 0.76  11/12 0148 0.85 1100 units/hr 11/12 1241 0.53 900 units/hr  Goal of Therapy:  Heparin level 0.3-0.7 units/ml Monitor platelets by anticoagulation protocol: Yes   Plan:  Heparin level therapeutic. Continue heparin at 900 units/hr. Recheck HL at 2100 to confirm. CBC daily  while on heparin drip.  13/12, PharmD Clinical Pharmacist 11/30/2019 1:29 PM

## 2019-11-30 NOTE — Progress Notes (Signed)
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  Per EMS: Patient was noted to be febrile with fever of 102, was noted to be hypoxic in 70s and was placed on nonrebreather.  She was found to have hypotension was started on IV fluids.  Lactic acid elevated at 5.7.  Evidence of AKI with creatinine up to 1.54.  AST: 87, alkaline phosphatase: 145, INR: 1.4.  UA showing possible UTI.  Code sepsis initiated and patient was started on broad-spectrum antibiotics Vanco and cefepime.  Left lower extremity Doppler ultrasound came back positive for acute DVT.  Patient started on heparin GTT.  Patient admitted for further evaluation and management of sepsis/septic shock.  Assessment & Plan:  Severe sepsis with septic shock: Requiring pressors. -In the setting of possible UTI.  UA is positive for leukocyte, WBC and bacteria.  Upon admission: Patient febrile with fever of 103.5, tachycardia, hypotension, elevated lactic acid and leukocytosis of 13,000. -COVID-19 negative.  Reviewed CTA chest, CT abdomen/pelvis. -Patient started on IV fluids, broad-spectrum antibiotics vancomycin and cefepime in ED-transitioned to PO Augmentin on 11/12 -MRSA PCR: Positive. -BP improving with pressors.  Lactic acid is trending down.  Appreciate PCCM help. -Procalcitonin: Elevated at 26.53--> 31.92--> 24.12.  Blood culture is negative till now.  Urine culture positive for more than 100 colonies of gram-negative rods-susceptibilities to follow. -Trend lactic acid, procalcitonin level.  Continue to monitor vitals closely.  Nonocclusive left lower  extremity DVT: -History of DVT in the past with history of underlying antiphospholipid antibody syndrome -On Coumadin at home. -Continue with heparin GTT for now.  Monitor H&H and signs of bleeding.  Acute hypoxemic respiratory failure: -Secondary to bilateral atelectasis. -Reviewed CTA chest.  No PE.  No pneumonia, no fluid overload.  COVID-19 negative.  Requiring 4 L of oxygen via nasal cannula. -We will try to wean off of oxygen as tolerated. -Continue chest PT.  Acute metabolic encephalopathy: -Likely in the setting of severe sepsis with underlying history of dementia.  On multiple AEDs -Passed bedside swallow evaluation.  Continue with PT/OT. -Fall/aspiration precautions  Macrocytic anemia -H&H dropped from 11.6-8.7--> 8.0.  MCV: 100.4 -Check B12, folate, iron studies.  Monitor H&H closely and transfuse as needed.  AKI: Resolved -Creatinine: 1.49, GFR: 37.  Continue with IV fluids.  Hold nephrotoxic medications.   Hypomagnesemia: Low this am Replenished  Hypophosphatemia: Replenished  Hypercalcemia: In the setting of hyperparathyroidism.  Continue with IV fluids.   -Calcium level is improving.  PTH is elevated-83.  Vitamin D level is pending -Patient was given calcitonin 300 units every 12 hours for 4 doses  History of seizure disorder: -Continue IV phenobarbital and phenytoin  Prolonged QT interval: Reviewed EKG.  Replace electrolytes.  Continue IV fluids.  Monitor closely on telemetry.  Dementia: With a mild behavioral disturbance.  Resume home meds once able to take p.o.  Mild LFT elevation: -Resolved  DVT prophylaxis: Full dose heparin Code Status: partial code (no CPR, no intubation-okay with everything else)-confirmed with the patient Family Communication: None present at bedside.  Plan of care discussed with patient in length and she verbalized understanding and agreed with it.  I called patient daughter and discussed about plan of  care and she verbalized  understanding.  Disposition Plan: SNF once stable Consultants:   PCCM  Procedures:  CT abdomen/pelvis with contrast CTA chest Doppler ultrasound of left lower extremity  Antimicrobials:   .  Cefepime  Vancomycin  Status is: Inpatient  Dispo: The patient is from: SNF-PT resources              Anticipated d/c is to: SNF              Anticipated d/c date is: >3 days              Patient currently not medically stable for the discharge   Subjective: Patient seen and examined.  Sleeping comfortably on the bed.  As per nurse: Patient was agitated last night however this morning she is comfortable however has been refusing to eat breakfast.  Remained afebrile, her blood pressure is improving however still requiring pressors.  Objective: Vitals:   11/30/19 0800 11/30/19 0900 11/30/19 1000 11/30/19 1100  BP: (!) 119/51 (!) 97/47 (!) 111/58 (!) 104/51  Pulse: 83 66 82 71  Resp: 19 17 18 19   Temp:      TempSrc:      SpO2: 99% 100% 94% 95%  Weight:      Height:        Intake/Output Summary (Last 24 hours) at 11/30/2019 1203 Last data filed at 11/30/2019 0800 Gross per 24 hour  Intake 4977.12 ml  Output 2250 ml  Net 2727.12 ml   Filed Weights   11/28/19 1837 11/29/19 1751  Weight: 74.8 kg 82.3 kg    Examination:  General exam: Appears calm and comfortable, on 4 L oxygen via nasal cannula, sleepy, not in acute distress Respiratory system: Clear to auscultation. Respiratory effort normal. Cardiovascular system: S1 & S2 heard, RRR. No JVD, murmurs, rubs, gallops or clicks. No pedal edema. Gastrointestinal system: Abdomen is nondistended, soft and nontender. No organomegaly or masses felt. Normal bowel sounds heard. Central nervous system: Sleepy Skin: No rashes, lesions or ulcers.   Data Reviewed: I have personally reviewed following labs and imaging studies  CBC: Recent Labs  Lab 11/28/19 1836 11/29/19 0810 11/30/19 0148  WBC 8.2 13.0* 8.3  NEUTROABS 7.7  11.9*  --   HGB 11.6* 8.7* 8.0*  HCT 35.8* 25.2* 23.9*  MCV 105.6* 100.4* 102.6*  PLT 367 242 207   Basic Metabolic Panel: Recent Labs  Lab 11/28/19 1836 11/28/19 1836 11/28/19 2322 11/29/19 0320 11/29/19 0810 11/29/19 1546 11/30/19 0148  NA 137  --   --  135  --   --  135  K 4.7  --   --  3.6  --   --  3.5  CL 101  --   --  106  --   --  109  CO2 19*  --   --  20*  --   --  20*  GLUCOSE 94  --   --  149*  --   --  92  BUN 24*  --   --  22  --   --  21  CREATININE 1.45*  --   --  1.49*  --   --  0.97  CALCIUM 12.2*   < > 11.1* 10.4* 10.0 9.1 8.7*  MG  --   --  1.4* 2.1  --   --  1.4*  PHOS  --   --  <1.0* 1.2*  --   --  2.8   < > = values in this  interval not displayed.   GFR: Estimated Creatinine Clearance: 59.4 mL/min (by C-G formula based on SCr of 0.97 mg/dL). Liver Function Tests: Recent Labs  Lab 11/28/19 1836 11/29/19 0320 11/30/19 0148  AST 87* 53* 40  ALT 43 30 25  ALKPHOS 145* 104 88  BILITOT 0.8 1.1 0.6  PROT 8.1 6.3* 6.0*  ALBUMIN 3.7 2.7* 2.4*   No results for input(s): LIPASE, AMYLASE in the last 168 hours. No results for input(s): AMMONIA in the last 168 hours. Coagulation Profile: Recent Labs  Lab 11/28/19 1836  INR 1.4*   Cardiac Enzymes: Recent Labs  Lab 11/28/19 2322  CKTOTAL 79   BNP (last 3 results) No results for input(s): PROBNP in the last 8760 hours. HbA1C: No results for input(s): HGBA1C in the last 72 hours. CBG: Recent Labs  Lab 11/29/19 1741 11/29/19 2339 11/30/19 0748  GLUCAP 83 99 95   Lipid Profile: No results for input(s): CHOL, HDL, LDLCALC, TRIG, CHOLHDL, LDLDIRECT in the last 72 hours. Thyroid Function Tests: Recent Labs    11/29/19 0320  TSH 0.898   Anemia Panel: No results for input(s): VITAMINB12, FOLATE, FERRITIN, TIBC, IRON, RETICCTPCT in the last 72 hours. Sepsis Labs: Recent Labs  Lab 11/28/19 2322 11/29/19 0320 11/29/19 1546 11/29/19 1840 11/30/19 0148  PROCALCITON 26.53  --  31.92  --   24.12  LATICACIDVEN 5.1* 2.4* 1.0 1.5  --     Recent Results (from the past 240 hour(s))  Blood Culture (routine x 2)     Status: None (Preliminary result)   Collection Time: 11/28/19  6:36 PM   Specimen: BLOOD  Result Value Ref Range Status   Specimen Description BLOOD BLOOD RIGHT HAND  Final   Special Requests   Final    BOTTLES DRAWN AEROBIC AND ANAEROBIC Blood Culture results may not be optimal due to an inadequate volume of blood received in culture bottles   Culture   Final    NO GROWTH 2 DAYS Performed at [facility], [address] [place], [place] [address]    Report Status PENDING  Incomplete  Urine culture     Status: Abnormal (Preliminary result)   Collection Time: 11/28/19  6:36 PM   Specimen: In/Out Cath Urine  Result Value Ref Range Status   Specimen Description   Final    IN/OUT CATH URINE Performed at [facility], [address] [place], [place] [address]    Special Requests   Final    NONE Performed at [facility], [address] [place], [place] [address]    Culture (A)  Final    >=100,000 COLONIES/mL GRAM NEGATIVE RODS IDENTIFICATION AND SUSCEPTIBILITIES TO FOLLOW Performed at [facility], [address] [place], [place] [address]    Report Status PENDING  Incomplete  Blood Culture (routine x 2)     Status: None (Preliminary result)   Collection Time: 11/28/19  6:41 PM   Specimen: BLOOD  Result Value Ref Range Status   Specimen Description BLOOD BLOOD LEFT HAND  Final   Special Requests   Final    BOTTLES DRAWN AEROBIC AND ANAEROBIC Blood Culture results may not be optimal due to an inadequate volume of blood received in culture bottles   Culture   Final    NO GROWTH 2 DAYS Performed at [facility], [address] [place], [place] [address]    Report Status PENDING  Incomplete  Respiratory Panel by RT PCR (Flu A&B, Covid) - Nasopharyngeal Swab  Status: None   Collection Time: 11/28/19  6:42  PM   Specimen: Nasopharyngeal Swab  Result Value Ref Range Status   SARS Coronavirus 2 by RT PCR NEGATIVE NEGATIVE Final    Comment: (NOTE) SARS-CoV-2 target nucleic acids are NOT DETECTED.  The SARS-CoV-2 RNA is generally detectable in upper respiratoy specimens during the acute phase of infection. The lowest concentration of SARS-CoV-2 viral copies this assay can detect is 131 copies/mL. A negative result does not preclude SARS-Cov-2 infection and should not be used as the sole basis for treatment or other patient management decisions. A negative result may occur with  improper specimen collection/handling, submission of specimen other than nasopharyngeal swab, presence of viral mutation(s) within the areas targeted by this assay, and inadequate number of viral copies (<131 copies/mL). A negative result must be combined with clinical observations, patient history, and epidemiological information. The expected result is Negative.  Fact Sheet for Patients:  https://www.moore.com/  Fact Sheet for Healthcare Providers:  https://www.young.biz/  This test is no t yet approved or cleared by the [place] FDA and  has been authorized for detection and/or diagnosis of SARS-CoV-2 by FDA under an Emergency Use Authorization (EUA). This EUA will remain  in effect (meaning this test can be used) for the duration of the COVID-19 declaration under Section 564(b)(1) of the Act, 21 U.S.C. section 360bbb-3(b)(1), unless the authorization is terminated or revoked sooner.     Influenza A by PCR NEGATIVE NEGATIVE Final   Influenza B by PCR NEGATIVE NEGATIVE Final    Comment: (NOTE) The Xpert Xpress SARS-CoV-2/FLU/RSV assay is intended as an aid in  the diagnosis of influenza from Nasopharyngeal swab specimens and  should not be used as a sole basis for treatment. Nasal washings and  aspirates are unacceptable for Xpert Xpress SARS-CoV-2/FLU/RSV    testing.  Fact Sheet for Patients: https://www.moore.com/  Fact Sheet for Healthcare Providers: https://www.young.biz/  This test is not yet approved or cleared by the [place] FDA and  has been authorized for detection and/or diagnosis of SARS-CoV-2 by  FDA under an Emergency Use Authorization (EUA). This EUA will remain  in effect (meaning this test can be used) for the duration of the  Covid-19 declaration under Section 564(b)(1) of the Act, 21  U.S.C. section 360bbb-3(b)(1), unless the authorization is  terminated or revoked. Performed at [facility], [address] [place], [place] [address]   MRSA PCR Screening     Status: Abnormal   Collection Time: 11/29/19 10:46 AM   Specimen: Nasal Mucosa; Nasopharyngeal  Result Value Ref Range Status   MRSA by PCR POSITIVE (A) NEGATIVE Final    Comment:        The GeneXpert MRSA Assay (FDA approved for NASAL specimens only), is one component of a comprehensive MRSA colonization surveillance program. It is not intended to diagnose MRSA infection nor to guide or monitor treatment for MRSA infections. RESULT CALLED TO, READ BACK BY AND VERIFIED WITH: Hyman Hopes 11/29/19 1210 KLW Performed at [facility], [address] [place], [place] [address]       Radiology Studies: CT Angio Chest PE W and/or Wo Contrast  Result Date: 11/28/2019 CLINICAL DATA:  Fever, short of breath, abdominal pain EXAM: CT ANGIOGRAPHY CHEST CT ABDOMEN AND PELVIS WITH CONTRAST TECHNIQUE: Multidetector CT imaging of the chest was performed using the standard protocol during bolus administration of intravenous contrast. Multiplanar CT image reconstructions and MIPs were obtained to evaluate the vascular anatomy. Multidetector CT imaging  of the abdomen and pelvis was performed using the standard protocol during bolus administration of intravenous contrast. CONTRAST:  27mL OMNIPAQUE IOHEXOL 350  MG/ML SOLN COMPARISON:  01/03/2009, 11/28/2019 FINDINGS: CTA CHEST FINDINGS Cardiovascular: This is a technically adequate evaluation of the pulmonary vasculature. There are 2 small eccentric filling defects within the left lower lobe segmental pulmonary arteries, which have an appearance most consistent with sequela of chronic thromboembolic disease. This is best seen on image 132 of series 5 and image 141 of series 5. No other signs of additional filling defects or acute pulmonary emboli. The heart is unremarkable without pericardial effusion. Mediastinum/Nodes: No enlarged mediastinal, hilar, or axillary lymph nodes. Thyroid gland, trachea, and esophagus demonstrate no significant findings. Lungs/Pleura: No acute airspace disease, effusion, or pneumothorax. Scattered hypoventilatory changes versus scarring at the lung bases. Central airways are patent. Musculoskeletal: No acute displaced fracture. Sclerosis and invagination of the superior endplate of T11 consistent with chronic compression fracture. Reconstructed images demonstrate no additional findings. Review of the MIP images confirms the above findings. CT ABDOMEN and PELVIS FINDINGS Hepatobiliary: No focal liver abnormality is seen. Noncalcified gallstones seen within the gallbladder neck, best visualized on delayed images. No gallbladder wall thickening or signs of acute cholecystitis. Pancreas: Unremarkable. No pancreatic ductal dilatation or surrounding inflammatory changes. Spleen: Normal in size without focal abnormality. Adrenals/Urinary Tract: Small left renal cortical cysts are noted. Mild bilateral renal cortical atrophy. Punctate calcifications are seen at the renal hila, likely vascular. No definite urinary tract calculi or obstructive uropathy. The adrenals and bladder are unremarkable. Stomach/Bowel: No bowel obstruction or ileus. Normal appendix right lower quadrant. No bowel wall thickening or inflammatory change. Vascular/Lymphatic: Aortic  atherosclerosis. No enlarged abdominal or pelvic lymph nodes. Reproductive: Uterus and bilateral adnexa are unremarkable. Other: No free fluid or free gas. No abdominal wall hernia. Musculoskeletal: Chronic appearing compression deformities are seen at T11 and L1. There are no acute displaced fractures. Bilateral hip arthroplasties are unremarkable. Reconstructed images demonstrate no additional findings. Review of the MIP images confirms the above findings. IMPRESSION: 1. Small eccentric filling defects within the left lower lobe segmental pulmonary arteries, which have an appearance most consistent with sequela of chronic thromboembolic disease. No signs of acute pulmonary emboli. 2. Noncalcified gallstone.  No evidence of acute cholecystitis. 3. Aortic Atherosclerosis (ICD10-I70.0). Electronically Signed   By: Sharlet Salina M.D.   On: 11/28/2019 20:21   CT ABDOMEN PELVIS W CONTRAST  Result Date: 11/28/2019 CLINICAL DATA:  Fever, short of breath, abdominal pain EXAM: CT ANGIOGRAPHY CHEST CT ABDOMEN AND PELVIS WITH CONTRAST TECHNIQUE: Multidetector CT imaging of the chest was performed using the standard protocol during bolus administration of intravenous contrast. Multiplanar CT image reconstructions and MIPs were obtained to evaluate the vascular anatomy. Multidetector CT imaging of the abdomen and pelvis was performed using the standard protocol during bolus administration of intravenous contrast. CONTRAST:  33mL OMNIPAQUE IOHEXOL 350 MG/ML SOLN COMPARISON:  01/03/2009, 11/28/2019 FINDINGS: CTA CHEST FINDINGS Cardiovascular: This is a technically adequate evaluation of the pulmonary vasculature. There are 2 small eccentric filling defects within the left lower lobe segmental pulmonary arteries, which have an appearance most consistent with sequela of chronic thromboembolic disease. This is best seen on image 132 of series 5 and image 141 of series 5. No other signs of additional filling defects or acute  pulmonary emboli. The heart is unremarkable without pericardial effusion. Mediastinum/Nodes: No enlarged mediastinal, hilar, or axillary lymph nodes. Thyroid gland, trachea, and esophagus demonstrate no significant findings. Lungs/Pleura:  No acute airspace disease, effusion, or pneumothorax. Scattered hypoventilatory changes versus scarring at the lung bases. Central airways are patent. Musculoskeletal: No acute displaced fracture. Sclerosis and invagination of the superior endplate of T11 consistent with chronic compression fracture. Reconstructed images demonstrate no additional findings. Review of the MIP images confirms the above findings. CT ABDOMEN and PELVIS FINDINGS Hepatobiliary: No focal liver abnormality is seen. Noncalcified gallstones seen within the gallbladder neck, best visualized on delayed images. No gallbladder wall thickening or signs of acute cholecystitis. Pancreas: Unremarkable. No pancreatic ductal dilatation or surrounding inflammatory changes. Spleen: Normal in size without focal abnormality. Adrenals/Urinary Tract: Small left renal cortical cysts are noted. Mild bilateral renal cortical atrophy. Punctate calcifications are seen at the renal hila, likely vascular. No definite urinary tract calculi or obstructive uropathy. The adrenals and bladder are unremarkable. Stomach/Bowel: No bowel obstruction or ileus. Normal appendix right lower quadrant. No bowel wall thickening or inflammatory change. Vascular/Lymphatic: Aortic atherosclerosis. No enlarged abdominal or pelvic lymph nodes. Reproductive: Uterus and bilateral adnexa are unremarkable. Other: No free fluid or free gas. No abdominal wall hernia. Musculoskeletal: Chronic appearing compression deformities are seen at T11 and L1. There are no acute displaced fractures. Bilateral hip arthroplasties are unremarkable. Reconstructed images demonstrate no additional findings. Review of the MIP images confirms the above findings. IMPRESSION: 1.  Small eccentric filling defects within the left lower lobe segmental pulmonary arteries, which have an appearance most consistent with sequela of chronic thromboembolic disease. No signs of acute pulmonary emboli. 2. Noncalcified gallstone.  No evidence of acute cholecystitis. 3. Aortic Atherosclerosis (ICD10-I70.0). Electronically Signed   By: Sharlet Salina M.D.   On: 11/28/2019 20:21   US Venous Img Lower Unilateral Left  Result Date: 11/28/2019 CLINICAL DATA:  Leg pain EXAM: LEFT LOWER EXTREMITY VENOUS DOPPLER ULTRASOUND TECHNIQUE: Gray-scale sonography with compression, as well as color and duplex ultrasound, were performed to evaluate the deep venous system(s) from the level of the common femoral vein through the popliteal and proximal calf veins. COMPARISON:  None. FINDINGS: VENOUS Nonocclusive thrombus is noted throughout the left common femoral vein, femoral vein, and popliteal vein. The waveforms within the femoral vein and common femoral vein are unremarkable. There is decreased phasicity within the popliteal vein consistent with a more proximal thrombus. The tibial vasculature was not well visualized on this study. Limited views of the contralateral common femoral vein are unremarkable. OTHER None. Limitations: none IMPRESSION: Nonocclusive DVT throughout the left lower extremity as detailed above. Electronically Signed   By: Katherine Mantle M.D.   On: 11/28/2019 23:18   DG Chest Port 1 View  Result Date: 11/28/2019 CLINICAL DATA:  Questionable sepsis.  Evaluate for pneumonia. EXAM: PORTABLE CHEST 1 VIEW COMPARISON:  03/30/2017 FINDINGS: The stable cardiomediastinal contours. No pleural effusion identified. No airspace consolidation identified to suggest pneumonia. Healing left distal clavicle fracture. IMPRESSION: No active disease. Electronically Signed   By: Signa Kell M.D.   On: 11/28/2019 19:31    Scheduled Meds: . amoxicillin-clavulanate  1 tablet Oral Q8H  . Chlorhexidine  Gluconate Cloth  6 each Topical Q0600  . cinacalcet  30 mg Oral BID WC  . feeding supplement  237 mL Oral BID BM  . mouth rinse  15 mL Mouth Rinse BID  . midodrine  10 mg Oral Once  . midodrine  10 mg Oral TID WC  . multivitamin with minerals  1 tablet Oral Daily  . mupirocin ointment  1 application Nasal BID  . PHENObarbital  60 mg  Intravenous Q12H  . phenytoin (DILANTIN) IV  100 mg Intravenous Q12H   Continuous Infusions: . sodium chloride 150 mL/hr at 11/30/19 0800  . sodium chloride    . heparin 900 Units/hr (11/30/19 0800)  . norepinephrine (LEVOPHED) Adult infusion 3 mcg/min (11/30/19 0800)     LOS: 2 days   Time spent: 40 minutes   Tiffiney Sparrow Estill Cotta, MD [facility]  If 7PM-7AM, please contact night-coverage [email or web address] 11/30/2019, 12:03 PM

## 2019-11-30 NOTE — Plan of Care (Signed)
Discussed with patient plan of care for the evening, pain management and pure wick care with some teach back displayed

## 2019-12-01 LAB — CBC
HCT: 23.4 % — ABNORMAL LOW (ref 36.0–46.0)
Hemoglobin: 8 g/dL — ABNORMAL LOW (ref 12.0–15.0)
MCH: 34.3 pg — ABNORMAL HIGH (ref 26.0–34.0)
MCHC: 34.2 g/dL (ref 30.0–36.0)
MCV: 100.4 fL — ABNORMAL HIGH (ref 80.0–100.0)
Platelets: 206 10*3/uL (ref 150–400)
RBC: 2.33 MIL/uL — ABNORMAL LOW (ref 3.87–5.11)
RDW: 13.4 % (ref 11.5–15.5)
WBC: 5.8 10*3/uL (ref 4.0–10.5)
nRBC: 0 % (ref 0.0–0.2)

## 2019-12-01 LAB — GLUCOSE, CAPILLARY
Glucose-Capillary: 101 mg/dL — ABNORMAL HIGH (ref 70–99)
Glucose-Capillary: 110 mg/dL — ABNORMAL HIGH (ref 70–99)
Glucose-Capillary: 130 mg/dL — ABNORMAL HIGH (ref 70–99)
Glucose-Capillary: 68 mg/dL — ABNORMAL LOW (ref 70–99)
Glucose-Capillary: 92 mg/dL (ref 70–99)

## 2019-12-01 LAB — COMPREHENSIVE METABOLIC PANEL
ALT: 19 U/L (ref 0–44)
AST: 26 U/L (ref 15–41)
Albumin: 2.3 g/dL — ABNORMAL LOW (ref 3.5–5.0)
Alkaline Phosphatase: 84 U/L (ref 38–126)
Anion gap: 5 (ref 5–15)
BUN: 12 mg/dL (ref 8–23)
CO2: 19 mmol/L — ABNORMAL LOW (ref 22–32)
Calcium: 8.1 mg/dL — ABNORMAL LOW (ref 8.9–10.3)
Chloride: 112 mmol/L — ABNORMAL HIGH (ref 98–111)
Creatinine, Ser: 0.78 mg/dL (ref 0.44–1.00)
GFR, Estimated: >60 mL/min (ref >60)
Glucose, Bld: 106 mg/dL — ABNORMAL HIGH (ref 70–99)
Potassium: 3 mmol/L — ABNORMAL LOW (ref 3.5–5.1)
Sodium: 136 mmol/L (ref 135–145)
Total Bilirubin: 0.5 mg/dL (ref 0.3–1.2)
Total Protein: 5.7 g/dL — ABNORMAL LOW (ref 6.5–8.1)

## 2019-12-01 LAB — PROTIME-INR
INR: 1.7 — ABNORMAL HIGH (ref 0.8–1.2)
Prothrombin Time: 19.2 seconds — ABNORMAL HIGH (ref 11.4–15.2)

## 2019-12-01 LAB — HEPARIN LEVEL (UNFRACTIONATED)
Heparin Unfractionated: 0.46 IU/mL (ref 0.30–0.70)
Heparin Unfractionated: 0.52 IU/mL (ref 0.30–0.70)

## 2019-12-01 LAB — CALCIUM
Calcium: 8.1 mg/dL — ABNORMAL LOW (ref 8.9–10.3)
Calcium: 7.7 mg/dL — ABNORMAL LOW (ref 8.9–10.3)
Calcium: 8 mg/dL — ABNORMAL LOW (ref 8.9–10.3)

## 2019-12-01 LAB — PHOSPHORUS: Phosphorus: 1.5 mg/dL — ABNORMAL LOW (ref 2.5–4.6)

## 2019-12-01 LAB — MAGNESIUM: Magnesium: 1.4 mg/dL — ABNORMAL LOW (ref 1.7–2.4)

## 2019-12-01 LAB — PROCALCITONIN: Procalcitonin: 11.15 ng/mL

## 2019-12-01 MED ORDER — DEXTROSE-NACL 5-0.9 % IV SOLN: INTRAVENOUS | Status: DC

## 2019-12-01 MED ORDER — POTASSIUM CHLORIDE CRYS ER 20 MEQ PO TBCR
40.0000 meq | EXTENDED_RELEASE_TABLET | Freq: Two times a day (BID) | ORAL | Status: AC
  Administered 2019-12-01 (×2): 40 meq via ORAL
  Filled 2019-12-01 (×3): qty 2

## 2019-12-01 MED ORDER — K PHOS MONO-SOD PHOS DI & MONO 155-852-130 MG PO TABS
500.0000 mg | ORAL_TABLET | ORAL | Status: AC
  Administered 2019-12-01: 500 mg via ORAL
  Filled 2019-12-01 (×3): qty 2

## 2019-12-01 MED ORDER — MAGNESIUM SULFATE 4 GM/100ML IV SOLN
4.0000 g | Freq: Once | INTRAVENOUS | Status: AC
  Administered 2019-12-01: 4 g via INTRAVENOUS
  Filled 2019-12-01: qty 100

## 2019-12-01 MED ORDER — WARFARIN SODIUM 6 MG PO TABS
7.0000 mg | ORAL_TABLET | Freq: Once | ORAL | Status: AC
  Administered 2019-12-01: 7 mg via ORAL
  Filled 2019-12-01: qty 1

## 2019-12-01 MED ORDER — WARFARIN - PHARMACIST DOSING INPATIENT
Freq: Every day | Status: DC
  Filled 2019-12-01: qty 1

## 2019-12-01 NOTE — Progress Notes (Signed)
ANTICOAGULATION CONSULT NOTE  Pharmacy Consult for Heparin Indication: VTE treatment  No Known Allergies  Patient Measurements: Height: 5\' 10"  (177.8 cm) Weight: 75.5 kg (166 lb 7.2 oz) IBW/kg (Calculated) : 68.5 HEPARIN DW (KG): 82.3  Vital Signs: Temp: 97.9 F (36.6 C) (11/13 1108) Temp Source: Oral (11/13 1108) BP: 109/48 (11/13 1108) Pulse Rate: 67 (11/13 1108)  Labs: Recent Labs    11/28/19 1836 11/28/19 1836 11/28/19 2322 11/29/19 0320 11/29/19 0810 11/30/19 0148 11/30/19 0148 11/30/19 1241 11/30/19 1241 11/30/19 2050 12/01/19 0558 12/01/19 1148 12/01/19 1357  HGB 11.6*  --   --   --    < > 8.0*   < > 8.7*  --   --  8.0*  --   --   HCT 35.8*  --   --   --    < > 23.9*  --  24.8*  --   --  23.4*  --   --   PLT 367  --   --   --    < > 207  --  215  --   --  206  --   --   APTT <24*  --   --   --   --   --   --   --   --   --   --   --   --   LABPROT 16.7*  --   --   --   --   --   --   --   --   --   --  19.2*  --   INR 1.4*  --   --   --   --   --   --   --   --   --   --  1.7*  --   HEPARINUNFRC  --   --   --   --    < > 0.85*   < > 0.53   < > 0.24* 0.46  --  0.52  CREATININE 1.45*   < >  --  1.49*  --  0.97  --   --   --   --  0.78  --   --   CKTOTAL  --   --  79  --   --   --   --   --   --   --   --   --   --    < > = values in this interval not displayed.   Estimated Creatinine Clearance: 66.7 mL/min (by C-G formula based on SCr of 0.78 mg/dL).  Medical History: Past Medical History:  Diagnosis Date  . Epilepsy (HCC)      Assessment: H/H downtrended, PLTs 12/03/19.  Initiating Heparin for VTE treatment.  Pt transitioned from subtherapeutic warfarin to Heparin infusion while inpatient for treatment of pre-existing non-acute PE visualized on imaging. Heparin level was therapeutic x1 at 0.54 on 11/11 0810.   Baseline INR = 1.4, aPTT = < 24. Date TIME HL Dosing Rate 11/11 0810 0.54 1200 units/hr 11/11 1546 0.76  11/12 0148 0.85 1100  units/hr 11/12 1241 0.53 900 units/hr 11/12 2050 0.24 ----  11/13  0558 0.46 Therapeutic on 1050 units/hr 11/13   1357    0.52     Therapeutic on 1050 units/hr  Goal of Therapy:  Heparin level 0.3-0.7 units/ml Monitor platelets by anticoagulation protocol: Yes   Plan:  Heparin level therapeutic x2.  Continue Heparin at 1050 units/hr. Will recheck HL  in  AM and CBC daily while on heparin drip.  Marty Heck, PharmD, BCPS Clinical Pharmacist 12/01/2019 2:25 PM

## 2019-12-01 NOTE — Progress Notes (Addendum)
PHARMACY CONSULT NOTE - FOLLOW UP  Pharmacy Consult for Electrolyte Monitoring and Replacement   Recent Labs: Potassium (mmol/L)  Date Value  12/01/2019 3.0 (L)   Magnesium (mg/dL)  Date Value  76/16/0737 1.4 (L)   Calcium (mg/dL)  Date Value  10/62/6948 8.1 (L)  12/01/2019 8.1 (L)   Albumin (g/dL)  Date Value  54/62/7035 2.3 (L)   Phosphorus (mg/dL)  Date Value  00/93/8182 1.5 (L)   Sodium (mmol/L)  Date Value  12/01/2019 136   Assessment: K 3.0   Mag 1.4  Phos 1.5  Scr 0.78   Ca 8.1  Alb 2.3  Corrected Ca 9.5  Goal of Therapy:  Mg WNL K >/= 4.0  Plan:  MD has ordered KCL po bid x 2 doses. Will order KPhos neutral PO 500mg  q4h x 2 doses Will order Magnesium sulfate 4 gram IV x 1 (note pt given 2 gram IV Mag x 2 yesterday and Mag has not increased. Will recheck this evening) F/U labs in am   ,PharmD Clinical Pharmacist 12/01/2019 10:38 AM

## 2019-12-01 NOTE — Progress Notes (Signed)
ANTICOAGULATION CONSULT NOTE  Pharmacy Consult for Heparin Indication: VTE treatment  No Known Allergies  Patient Measurements: Height: 5\' 10"  (177.8 cm) Weight: 75.5 kg (166 lb 7.2 oz) IBW/kg (Calculated) : 68.5 HEPARIN DW (KG): 82.3  Vital Signs: Temp: 98.7 F (37.1 C) (11/13 0300) Temp Source: Oral (11/13 0300) BP: 105/51 (11/13 0630) Pulse Rate: 65 (11/13 0630)  Labs: Recent Labs    11/28/19 1836 11/28/19 1836 11/28/19 2322 11/29/19 0320 11/29/19 0810 11/30/19 0148 11/30/19 0148 11/30/19 1241 11/30/19 2050 12/01/19 0558  HGB 11.6*  --   --   --    < > 8.0*   < > 8.7*  --  8.0*  HCT 35.8*  --   --   --    < > 23.9*  --  24.8*  --  23.4*  PLT 367  --   --   --    < > 207  --  215  --  206  APTT <24*  --   --   --   --   --   --   --   --   --   LABPROT 16.7*  --   --   --   --   --   --   --   --   --   INR 1.4*  --   --   --   --   --   --   --   --   --   HEPARINUNFRC  --   --   --   --    < > 0.85*   < > 0.53 0.24* 0.46  CREATININE 1.45*   < >  --  1.49*  --  0.97  --   --   --  0.78  CKTOTAL  --   --  79  --   --   --   --   --   --   --    < > = values in this interval not displayed.   Estimated Creatinine Clearance: 66.7 mL/min (by C-G formula based on SCr of 0.78 mg/dL).  Medical History: Past Medical History:  Diagnosis Date  . Epilepsy (HCC)      Assessment: H/H downtrended, PLTs 12/03/19.  Initiating Heparin for VTE treatment.  Pt transitioned from subtherapeutic warfarin to Heparin infusion while inpatient for treatment of pre-existing non-acute PE visualized on imaging. Heparin level was therapeutic x1 at 0.54 on 11/11 0810.   Baseline INR = 1.4, aPTT = < 24. Date TIME HL Dosing Rate 11/11 0810 0.54 1200 units/hr 11/11 1546 0.76  11/12 0148 0.85 1100 units/hr 11/12 1241 0.53 900 units/hr 11/12 2050 0.24 ----  11/13  0558 0.46 Therapeutic on 1050 units/hr  Goal of Therapy:  Heparin level 0.3-0.7 units/ml Monitor platelets by  anticoagulation protocol: Yes   Plan:  Heparin level therapeutic.  Continue Heparin at 1050 units/hr. Will recheck HL in 8 hours to confirm and CBC daily while on heparin drip.  12/13, PharmD Clinical Pharmacist 12/01/2019 6:59 AM

## 2019-12-01 NOTE — Progress Notes (Addendum)
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  Per EMS: Patient was noted to be febrile with fever of 102, was noted to be hypoxic in 70s and was placed on nonrebreather.  She was found to have hypotension was started on IV fluids.  Lactic acid elevated at 5.7.  Evidence of AKI with creatinine up to 1.54.  AST: 87, alkaline phosphatase: 145, INR: 1.4.  UA showing possible UTI.  Code sepsis initiated and patient was started on broad-spectrum antibiotics Vanco and cefepime.  Left lower extremity Doppler ultrasound came back positive for acute DVT.  Patient started on heparin GTT.  Patient admitted for further evaluation and management of sepsis/septic shock.  Assessment & Plan:  Severe sepsis with septic shock:   -In the setting of UTI.  UA is positive for leukocyte, WBC and bacteria.  Upon admission: Patient febrile with fever of 103.5, tachycardia, hypotension, elevated lactic acid and leukocytosis of 13,000. -Patient started on IV fluids, broad-spectrum antibiotics vancomycin and cefepime in ED-transitioned to PO Augmentin on 11/12 -Urine culture positive for E. coli-pansensitive.  Continue Augmentin -BP improving-she is off of pressors more than 24 hours. - Lactic acid is trending down.  Appreciate PCCM help. -Procalcitonin: Elevated at 26.53--> 31.92--> 24.12--> 11.15.  Remained afebrile overnight. -Blood culture is negative till now.  -Transfer patient to the floor.  Nonocclusive left lower extremity DVT: -History of DVT in the past with history of underlying antiphospholipid antibody syndrome -On  Coumadin at home (due to insurance issues). -Discussed with pharmacy-placed pharmacy consult for Coumadin-continue to bridge with IV heparin until INR is therapeutic.  Acute hypoxemic respiratory failure: -Secondary to bilateral atelectasis. -Reviewed CTA chest.  No PE.  No pneumonia, no fluid overload.  COVID-19 negative.  -Continue chest PT. -Resolved.  Patient is maintaining oxygen saturation on room air.  Acute metabolic encephalopathy: -Improved mentation -Likely in the setting of severe sepsis with underlying history of dementia.  On multiple AEDs -Passed bedside swallow evaluation.  Continue with PT/OT. -Fall/aspiration precautions  Macrocytic anemia -H&H dropped from 11.6-8.7--> 8.0.  MCV: 100.4 -B12, folate, TSH: WNL.  Iron studies: Iron: 58, TIBC: 158, ferritin elevated (likely secondary to underlying infection).  Monitor H&H closely and transfuse as needed.  AKI: Resolved -Creatinine: 1.49, GFR: 37.  Continue with IV fluids.  Hold nephrotoxic medications.   Hypomagnesemia: Replenished  Hypophosphatemia: Replenished  Hypokalemia: Replenished  Hypercalcemia: In the setting of hyperparathyroidism.  Continue with IV fluids.   -Calcium level is improving.  PTH is elevated-83.  Vitamin D level is pending -Patient was given calcitonin 300 units every 12 hours for 4 doses  History of seizure disorder: -Continue home dose of p.o. phenobarbital and phenytoin  Dementia: Improved.  Continue to monitor.  Supportive care.  Mild LFT elevation: -Resolved  DVT prophylaxis: Full dose heparin Code Status: partial code (no CPR, no intubation-okay with everything else)-confirmed with the patient Family Communication: None present at bedside.  Plan of care discussed with patient in length and she verbalized understanding and agreed with it.  Discussed plan of care with the patient's daughter over the phone and she verbalized understanding.  Disposition Plan: SNF once  stable Consultants:   PCCM  Procedures:  CT abdomen/pelvis with contrast CTA chest Doppler ultrasound of left lower extremity  Antimicrobials:   Cefepime  Vancomycin  Status is: Inpatient  Dispo: The patient is from: SNF-PT resources              Anticipated d/c is to: SNF              Anticipated d/c date is: 12/02/2019              Patient currently not medically stable for the discharge   Subjective: Patient seen and examined.  Resting comfortably on the bed.  Tells me that she feels better this morning, denies fever, chills, nausea, vomiting, abdominal pain, chest pain or shortness of breath.  Objective: Vitals:   12/01/19 0800 12/01/19 0900 12/01/19 1000 12/01/19 1108  BP: (!) 110/47 (!) 115/50 (!) 108/50 (!) 109/48  Pulse: 68 61 (!) 59 67  Resp: 19 18 19 16   Temp:    97.9 F (36.6 C)  TempSrc:    Oral  SpO2: 100% 100% 100% 100%  Weight:      Height:        Intake/Output Summary (Last 24 hours) at 12/01/2019 1209 Last data filed at 12/01/2019 0800 Gross per 24 hour  Intake 4404.86 ml  Output 3450 ml  Net 954.86 ml   Filed Weights   11/28/19 1837 11/29/19 1751 12/01/19 0128  Weight: 74.8 kg 82.3 kg 75.5 kg    Examination:  General exam: Appears calm and comfortable, on room air, communicating well Respiratory system: Clear to auscultation. Respiratory effort normal. Cardiovascular system: S1 & S2 heard, RRR. No JVD, murmurs, rubs, gallops or clicks. No pedal edema. Gastrointestinal system: Abdomen is nondistended, soft and nontender. No organomegaly or masses felt. Normal bowel sounds heard. Central nervous system: Alert and oriented. No focal neurological deficits. Extremities: Symmetric 5 x 5 power. Skin: No rashes, lesions or ulcers. Psychiatry: Judgement and insight appear normal. Mood & affect appropriate.   Data Reviewed: I have personally reviewed following labs and imaging studies  CBC: Recent Labs  Lab 11/28/19 1836 11/29/19 0810  11/30/19 0148 11/30/19 1241 12/01/19 0558  WBC 8.2 13.0* 8.3 7.7 5.8  NEUTROABS 7.7 11.9*  --   --   --   HGB 11.6* 8.7* 8.0* 8.7* 8.0*  HCT 35.8* 25.2* 23.9* 24.8* 23.4*  MCV 105.6* 100.4* 102.6* 101.2* 100.4*  PLT 367 242 207 215 206   Basic Metabolic Panel: Recent Labs  Lab 11/28/19 1836 11/28/19 1836 11/28/19 2322 11/28/19 2322 11/29/19 0320 11/29/19 0810 11/29/19 1546 11/30/19 0148 11/30/19 1241 11/30/19 2050 12/01/19 0558  NA 137  --   --   --  135  --   --  135  --   --  136  K 4.7  --   --   --  3.6  --   --  3.5  --   --  3.0*  CL 101  --   --   --  106  --   --  109  --   --  112*  CO2 19*  --   --   --  20*  --   --  20*  --   --  19*  GLUCOSE 94  --   --   --  149*  --   --  92  --   --  106*  BUN 24*  --   --   --  22  --   --  21  --   --  12  CREATININE 1.45*  --   --   --  1.49*  --   --  0.97  --   --  0.78  CALCIUM 12.2*   < > 11.1*   < > 10.4*   < > 9.1 8.7* 8.7* 8.2* 8.1*  8.1*  MG  --   --  1.4*  --  2.1  --   --  1.4*  --   --   --   PHOS  --   --  <1.0*  --  1.2*  --   --  2.8  --   --  1.5*   < > = values in this interval not displayed.   GFR: Estimated Creatinine Clearance: 66.7 mL/min (by C-G formula based on SCr of 0.78 mg/dL). Liver Function Tests: Recent Labs  Lab 11/28/19 1836 11/29/19 0320 11/30/19 0148 12/01/19 0558  AST 87* 53* 40 26  ALT 43 30 25 19   ALKPHOS 145* 104 88 84  BILITOT 0.8 1.1 0.6 0.5  PROT 8.1 6.3* 6.0* 5.7*  ALBUMIN 3.7 2.7* 2.4* 2.3*   No results for input(s): LIPASE, AMYLASE in the last 168 hours. No results for input(s): AMMONIA in the last 168 hours. Coagulation Profile: Recent Labs  Lab 11/28/19 1836  INR 1.4*   Cardiac Enzymes: Recent Labs  Lab 11/28/19 2322  CKTOTAL 79   BNP (last 3 results) No results for input(s): PROBNP in the last 8760 hours. HbA1C: No results for input(s): HGBA1C in the last 72 hours. CBG: Recent Labs  Lab 11/30/19 1613 11/30/19 2038 11/30/19 2329 12/01/19 0340  12/01/19 0750  GLUCAP 81 74 71 68* 101*   Lipid Profile: No results for input(s): CHOL, HDL, LDLCALC, TRIG, CHOLHDL, LDLDIRECT in the last 72 hours. Thyroid Function Tests: Recent Labs    11/30/19 1241  TSH 1.555   Anemia Panel: Recent Labs    11/30/19 1241  VITAMINB12 573  FOLATE 8.6  FERRITIN 432*  TIBC 158*  IRON 58   Sepsis Labs: Recent Labs  Lab 11/28/19 2322 11/29/19 0320 11/29/19 1546 11/29/19 1840 11/30/19 0148 12/01/19 0558  PROCALCITON 26.53  --  31.92  --  24.12 11.15  LATICACIDVEN 5.1* 2.4* 1.0 1.5  --   --     Recent Results (from the past 240 hour(s))  Blood Culture (routine x 2)     Status: None (Preliminary result)   Collection Time: 11/28/19  6:36 PM   Specimen: BLOOD  Result Value Ref Range Status   Specimen Description BLOOD BLOOD RIGHT HAND  Final   Special Requests   Final    BOTTLES DRAWN AEROBIC AND ANAEROBIC Blood Culture results may not be optimal due to an inadequate volume of blood received in culture bottles   Culture   Final    NO GROWTH 3 DAYS Performed at [facility], [address], [place], [place]    Report Status PENDING  Incomplete  Urine culture     Status: Abnormal   Collection Time: 11/28/19  6:36 PM   Specimen: In/Out Cath Urine  Result Value Ref Range Status   Specimen Description   Final    IN/OUT CATH URINE Performed at [facility], [address], [place], [place]    Special Requests   Final    NONE Performed at [facility], [address] [place], [place]    Culture >=100,000 COLONIES/mL ESCHERICHIA COLI (A)  Final   Report Status 11/30/2019 FINAL  Final   Organism ID, Bacteria ESCHERICHIA COLI (A)  Final      Susceptibility   Escherichia coli - MIC*    AMPICILLIN 4 SENSITIVE Sensitive     CEFAZOLIN <=4 SENSITIVE Sensitive     CEFEPIME <=0.12 SENSITIVE Sensitive     CEFTRIAXONE <=0.25 SENSITIVE Sensitive     CIPROFLOXACIN <=0.25 SENSITIVE  Sensitive     GENTAMICIN <=1 SENSITIVE Sensitive     IMIPENEM <=0.25 SENSITIVE Sensitive     NITROFURANTOIN <=16 SENSITIVE Sensitive     TRIMETH/SULFA <=20 SENSITIVE Sensitive     AMPICILLIN/SULBACTAM <=2 SENSITIVE Sensitive     PIP/TAZO <=4 SENSITIVE Sensitive     * >=100,000 COLONIES/mL ESCHERICHIA COLI  Blood Culture (routine x 2)     Status: None (Preliminary result)   Collection Time: 11/28/19  6:41 PM   Specimen: BLOOD  Result Value Ref Range Status   Specimen Description BLOOD BLOOD LEFT HAND  Final   Special Requests   Final    BOTTLES DRAWN AEROBIC AND ANAEROBIC Blood Culture results may not be optimal due to an inadequate volume of blood received in culture bottles   Culture   Final    NO GROWTH 3 DAYS Performed at [facility], [address] [place], [place] [address]    Report Status PENDING  Incomplete  Respiratory Panel by RT PCR (Flu A&B, Covid) - Nasopharyngeal Swab     Status: None   Collection Time: 11/28/19  6:42 PM   Specimen: Nasopharyngeal Swab  Result Value Ref Range Status   SARS Coronavirus 2 by RT PCR NEGATIVE NEGATIVE Final    Comment: (NOTE) SARS-CoV-2 target nucleic acids are NOT DETECTED.  The SARS-CoV-2 RNA is generally detectable in upper respiratoy specimens during the acute phase of infection. The lowest concentration of SARS-CoV-2 viral copies this assay can detect is 131 copies/mL. A negative result does not preclude SARS-Cov-2 infection and should not be used as the sole basis for treatment or other patient management decisions. A negative result may occur with  improper specimen collection/handling, submission of specimen other than nasopharyngeal swab, presence of viral mutation(s) within the areas targeted by this assay, and inadequate number of viral copies (<131 copies/mL). A negative result must be combined with clinical observations, patient history, and epidemiological information. The expected result is Negative.  Fact  Sheet for Patients:  https://www.moore.com/  Fact Sheet for Healthcare Providers:  https://www.young.biz/  This test is no t yet approved or cleared by the [place] FDA and  has been authorized for detection and/or diagnosis of SARS-CoV-2 by FDA under an Emergency Use Authorization (EUA). This EUA will remain  in effect (meaning this test can be used) for the duration of the COVID-19 declaration under Section 564(b)(1) of the Act, 21 U.S.C. section 360bbb-3(b)(1), unless the authorization is terminated or revoked sooner.     Influenza A by PCR NEGATIVE NEGATIVE Final   Influenza B by PCR NEGATIVE NEGATIVE Final    Comment: (NOTE) The Xpert Xpress SARS-CoV-2/FLU/RSV assay is intended as an aid in  the diagnosis of influenza from Nasopharyngeal swab specimens and  should not be used as a sole basis for treatment. Nasal washings and  aspirates are unacceptable for Xpert Xpress SARS-CoV-2/FLU/RSV  testing.  Fact Sheet for Patients: https://www.moore.com/  Fact Sheet for Healthcare Providers: https://www.young.biz/  This test is not yet approved or cleared by the [place] FDA and  has been authorized for detection and/or diagnosis of SARS-CoV-2 by  FDA under an Emergency  Use Authorization (EUA). This EUA will remain  in effect (meaning this test can be used) for the duration of the  Covid-19 declaration under Section 564(b)(1) of the Act, 21  U.S.C. section 360bbb-3(b)(1), unless the authorization is  terminated or revoked. Performed at [facility], [address] [place], [place] [address]   MRSA PCR Screening     Status: Abnormal   Collection Time: 11/29/19 10:46 AM   Specimen: Nasal Mucosa; Nasopharyngeal  Result Value Ref Range Status   MRSA by PCR POSITIVE (A) NEGATIVE Final    Comment:        The GeneXpert MRSA Assay (FDA approved for NASAL specimens only), is one  component of a comprehensive MRSA colonization surveillance program. It is not intended to diagnose MRSA infection nor to guide or monitor treatment for MRSA infections. RESULT CALLED TO, READ BACK BY AND VERIFIED WITH: Hyman Hopes 11/29/19 1210 KLW Performed at [facility], [address], [place], [place] [address]       Radiology Studies: No results found.  Scheduled Meds: . amoxicillin-clavulanate  1 tablet Oral Q12H  . Chlorhexidine Gluconate Cloth  6 each Topical Q0600  . cinacalcet  30 mg Oral BID WC  . feeding supplement  237 mL Oral BID BM  . mouth rinse  15 mL Mouth Rinse BID  . midodrine  10 mg Oral Once  . midodrine  10 mg Oral TID WC  . multivitamin with minerals  1 tablet Oral Daily  . mupirocin ointment  1 application Nasal BID  . phenobarbital  64.8 mg Oral BID  . phenytoin  200 mg Oral Daily   And  . phenytoin  100 mg Oral QHS  . phosphorus  500 mg Oral Q4H  . potassium chloride  40 mEq Oral BID   Continuous Infusions: . sodium chloride    . dextrose 5 % and 0.9% NaCl 150 mL/hr at 12/01/19 0615  . heparin 1,050 Units/hr (12/01/19 0615)     LOS: 3 days   Time spent: 40 minutes   Elvert Cumpton Estill Cotta, MD [facility]  If 7PM-7AM, please contact night-coverage [email or web address] 12/01/2019, 12:09 PM

## 2019-12-01 NOTE — Progress Notes (Signed)
ANTICOAGULATION CONSULT NOTE - Initial Consult  Pharmacy Consult for Warfarin Indication: DVT  No Known Allergies  Patient Measurements: Height: 5\' 10"  (177.8 cm) Weight: 75.5 kg (166 lb 7.2 oz) IBW/kg (Calculated) : 68.5   Vital Signs: Temp: 97.9 F (36.6 C) (11/13 1108) Temp Source: Oral (11/13 1108) BP: 109/48 (11/13 1108) Pulse Rate: 67 (11/13 1108)  Labs: Recent Labs    11/28/19 1836 11/28/19 1836 11/28/19 2322 11/29/19 0320 11/29/19 0810 11/30/19 0148 11/30/19 0148 11/30/19 1241 11/30/19 2050 12/01/19 0558 12/01/19 1148  HGB 11.6*  --   --   --    < > 8.0*   < > 8.7*  --  8.0*  --   HCT 35.8*  --   --   --    < > 23.9*  --  24.8*  --  23.4*  --   PLT 367  --   --   --    < > 207  --  215  --  206  --   APTT <24*  --   --   --   --   --   --   --   --   --   --   LABPROT 16.7*  --   --   --   --   --   --   --   --   --  19.2*  INR 1.4*  --   --   --   --   --   --   --   --   --  1.7*  HEPARINUNFRC  --   --   --   --    < > 0.85*   < > 0.53 0.24* 0.46  --   CREATININE 1.45*   < >  --  1.49*  --  0.97  --   --   --  0.78  --   CKTOTAL  --   --  79  --   --   --   --   --   --   --   --    < > = values in this interval not displayed.    Estimated Creatinine Clearance: 66.7 mL/min (by C-G formula based on SCr of 0.78 mg/dL).   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 Left lower extremity Doppler ultrasound came back positive for DVT. Pt restarting warfarin with heparin bridge.   Pt on warfarin 7 mg PO daily per nursing home MAG; pt also on phenobarbital and phenytoin currently and PTA. Last warfarin dose 11/9 per med rec.   Goal of Therapy:  INR 2-3 Monitor platelets by anticoagulation protocol: Yes   Plan:  Will give trial of home dose of warfarin 7 mg PO tonight Follow up INR in AM CBC at  least every three days  13/9 12/01/2019,2:05 PM

## 2019-12-02 LAB — CBC
HCT: 23.1 % — ABNORMAL LOW (ref 36.0–46.0)
HCT: 25.3 % — ABNORMAL LOW (ref 36.0–46.0)
Hemoglobin: 7.8 g/dL — ABNORMAL LOW (ref 12.0–15.0)
Hemoglobin: 8.6 g/dL — ABNORMAL LOW (ref 12.0–15.0)
MCH: 34.5 pg — ABNORMAL HIGH (ref 26.0–34.0)
MCH: 34.5 pg — ABNORMAL HIGH (ref 26.0–34.0)
MCHC: 33.8 g/dL (ref 30.0–36.0)
MCHC: 34 g/dL (ref 30.0–36.0)
MCV: 102.2 fL — ABNORMAL HIGH (ref 80.0–100.0)
MCV: 101.6 fL — ABNORMAL HIGH (ref 80.0–100.0)
Platelets: 197 10*3/uL (ref 150–400)
Platelets: 205 10*3/uL (ref 150–400)
RBC: 2.26 MIL/uL — ABNORMAL LOW (ref 3.87–5.11)
RBC: 2.49 MIL/uL — ABNORMAL LOW (ref 3.87–5.11)
RDW: 13.2 % (ref 11.5–15.5)
RDW: 13.2 % (ref 11.5–15.5)
WBC: 4 10*3/uL (ref 4.0–10.5)
WBC: 4.5 10*3/uL (ref 4.0–10.5)
nRBC: 0 % (ref 0.0–0.2)
nRBC: 0 % (ref 0.0–0.2)

## 2019-12-02 LAB — BASIC METABOLIC PANEL
Anion gap: 6 (ref 5–15)
BUN: 6 mg/dL — ABNORMAL LOW (ref 8–23)
CO2: 20 mmol/L — ABNORMAL LOW (ref 22–32)
Calcium: 8.1 mg/dL — ABNORMAL LOW (ref 8.9–10.3)
Chloride: 112 mmol/L — ABNORMAL HIGH (ref 98–111)
Creatinine, Ser: 0.64 mg/dL (ref 0.44–1.00)
GFR, Estimated: >60 mL/min (ref >60)
Glucose, Bld: 123 mg/dL — ABNORMAL HIGH (ref 70–99)
Potassium: 3.2 mmol/L — ABNORMAL LOW (ref 3.5–5.1)
Sodium: 138 mmol/L (ref 135–145)

## 2019-12-02 LAB — PROTIME-INR
INR: 1.9 — ABNORMAL HIGH (ref 0.8–1.2)
Prothrombin Time: 20.7 seconds — ABNORMAL HIGH (ref 11.4–15.2)

## 2019-12-02 LAB — GLUCOSE, CAPILLARY
Glucose-Capillary: 119 mg/dL — ABNORMAL HIGH (ref 70–99)
Glucose-Capillary: 122 mg/dL — ABNORMAL HIGH (ref 70–99)
Glucose-Capillary: 106 mg/dL — ABNORMAL HIGH (ref 70–99)
Glucose-Capillary: 101 mg/dL — ABNORMAL HIGH (ref 70–99)
Glucose-Capillary: 95 mg/dL (ref 70–99)

## 2019-12-02 LAB — PHOSPHORUS: Phosphorus: 1.6 mg/dL — ABNORMAL LOW (ref 2.5–4.6)

## 2019-12-02 LAB — CALCIUM
Calcium: 8 mg/dL — ABNORMAL LOW (ref 8.9–10.3)
Calcium: 8.3 mg/dL — ABNORMAL LOW (ref 8.9–10.3)
Calcium: 8.7 mg/dL — ABNORMAL LOW (ref 8.9–10.3)

## 2019-12-02 LAB — MAGNESIUM: Magnesium: 1.6 mg/dL — ABNORMAL LOW (ref 1.7–2.4)

## 2019-12-02 LAB — HEPARIN LEVEL (UNFRACTIONATED): Heparin Unfractionated: 0.3 IU/mL (ref 0.30–0.70)

## 2019-12-02 MED ORDER — POTASSIUM CHLORIDE CRYS ER 20 MEQ PO TBCR
20.0000 meq | EXTENDED_RELEASE_TABLET | ORAL | Status: AC
  Administered 2019-12-02 (×2): 20 meq via ORAL
  Filled 2019-12-02 (×2): qty 1

## 2019-12-02 MED ORDER — WARFARIN SODIUM 6 MG PO TABS
6.0000 mg | ORAL_TABLET | Freq: Once | ORAL | Status: AC
  Administered 2019-12-02: 6 mg via ORAL
  Filled 2019-12-02 (×2): qty 1

## 2019-12-02 MED ORDER — POTASSIUM PHOSPHATES 15 MMOLE/5ML IV SOLN
10.0000 mmol | Freq: Once | INTRAVENOUS | Status: AC
  Administered 2019-12-02: 10 mmol via INTRAVENOUS
  Filled 2019-12-02: qty 3.33

## 2019-12-02 MED ORDER — MAGNESIUM SULFATE 2 GM/50ML IV SOLN: 2.0000 g | Freq: Once | INTRAVENOUS | Status: DC

## 2019-12-02 MED ORDER — MAGNESIUM SULFATE 4 GM/100ML IV SOLN
4.0000 g | Freq: Once | INTRAVENOUS | Status: AC
  Administered 2019-12-02: 4 g via INTRAVENOUS
  Filled 2019-12-02: qty 100

## 2019-12-02 NOTE — Progress Notes (Signed)
PHARMACY CONSULT NOTE - FOLLOW UP  Pharmacy Consult for Electrolyte Monitoring and Replacement   Recent Labs: Potassium (mmol/L)  Date Value  12/02/2019 3.2 (L)   Magnesium (mg/dL)  Date Value  46/80/3212 1.6 (L)   Calcium (mg/dL)  Date Value  24/82/5003 8.0 (L)  12/02/2019 8.1 (L)   Albumin (g/dL)  Date Value  70/48/8891 2.3 (L)   Phosphorus (mg/dL)  Date Value  69/45/0388 1.6 (L)   Sodium (mmol/L)  Date Value  12/02/2019 138   Assessment: K 3.2   Mag 1.6  Phos 1.6  Scr 0.64   Ca 8.1  Alb 2.3  Corrected Ca 9.5  Goal of Therapy:  Mg WNL K >/= 4.0  Plan:  Will order KCL po q4h x 2 doses. MD has ordered Potassium phosphate 10 mmol IV x1-has 15 meq K.  (note that pt only received 1 dose of Kphos tabs from yesterday 11/13) Will order Magnesium sulfate 4 gram IV x 1 again (mag increased from 1.4 to 1.6 with 4 gram dose yesterday 11/13) F/U labs in am   Angelique Blonder ,PharmD Clinical Pharmacist 12/02/2019 7:54 AM

## 2019-12-02 NOTE — Progress Notes (Signed)
ANTICOAGULATION CONSULT NOTE  Pharmacy Consult for Heparin and Warfarin Indication: VTE treatment  No Known Allergies  Patient Measurements: Height: 5\' 10"  (177.8 cm) Weight: 75.5 kg (166 lb 7.2 oz) IBW/kg (Calculated) : 68.5 HEPARIN DW (KG): 82.3  Vital Signs: Temp: 99 F (37.2 C) (11/14 0725) Temp Source: Oral (11/14 0725) BP: 101/49 (11/14 0725) Pulse Rate: 82 (11/14 0725)  Labs: Recent Labs    11/30/19 0148 11/30/19 0148 11/30/19 1241 11/30/19 2050 12/01/19 0558 12/01/19 1148 12/01/19 1357 12/02/19 0631  HGB 8.0*   < > 8.7*  --  8.0*  --   --  7.8*  HCT 23.9*   < > 24.8*  --  23.4*  --   --  23.1*  PLT 207   < > 215  --  206  --   --  197  LABPROT  --   --   --   --   --  19.2*  --  20.7*  INR  --   --   --   --   --  1.7*  --  1.9*  HEPARINUNFRC 0.85*   < > 0.53   < > 0.46  --  0.52 0.30  CREATININE 0.97  --   --   --  0.78  --   --  0.64   < > = values in this interval not displayed.   Estimated Creatinine Clearance: 66.7 mL/min (by C-G formula based on SCr of 0.64 mg/dL).  Medical History: Past Medical History:  Diagnosis Date  . Epilepsy (HCC)      Assessment: H/H downtrended, PLTs 12/04/19.  Initiating Heparin for VTE treatment.  Pt transitioned from subtherapeutic warfarin to Heparin infusion while inpatient for treatment of pre-existing non-acute PE visualized on imaging.  RE-start Warfarin 11/13:on Coumadin PTA. Left lower extremity Doppler ultrasound came back positive for DVT. Pt restarting warfarin with heparin bridge.  Pt on warfarin 7 mg PO daily per nursing home MAG; pt also on phenobarbital and phenytoin currently and PTA. Last warfarin dose 11/9 per med rec.   Baseline INR = 1.4, aPTT = < 24. Date TIME HL Dosing Rate 11/11 0810 0.54 1200 units/hr 11/11 1546 0.76  11/12 0148 0.85 1100 units/hr 11/12 1241 0.53 900 units/hr 11/12 2050 0.24 ----  11/13  0558 0.46 Therapeutic on 1050 units/hr 11/13   1357    0.52     Therapeutic on 1050  units/hr  INR 11/13  INR  1.7   Warfarin 7 mg 11/14  INR  1.9  DDI: phenobarb, augmentin  Goal of Therapy:  INR 2.0-3.0 Heparin level 0.3-0.7 units/ml Monitor platelets by anticoagulation protocol: Yes   Plan:  Heparin: 11/14 HL= 0.30. barely therapeutic.  Will slightly increase Heparin to 1100 units/hr units/hr. Will recheck HL in  AM and CBC daily while on heparin drip.Bridging with Warfarin  Warfarin: INR 1.9.  barely subtherapeutic after 1 dose of Warfarin. Will order Warfarin 6 mg x 1 tonight.  F/u INR in am.   12/14, PharmD, BCPS Clinical Pharmacist 12/02/2019 8:05 AM

## 2019-12-02 NOTE — Progress Notes (Signed)
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  Per EMS: Patient was noted to be febrile with fever of 102, was noted to be hypoxic in 70s and was placed on nonrebreather.  She was found to have hypotension was started on IV fluids.  Lactic acid elevated at 5.7.  Evidence of AKI with creatinine up to 1.54.  AST: 87, alkaline phosphatase: 145, INR: 1.4.  UA showing possible UTI.  Code sepsis initiated and patient was started on broad-spectrum antibiotics Vanco and cefepime.  Left lower extremity Doppler ultrasound came back positive for acute DVT.  Patient started on heparin GTT.  Patient admitted for further evaluation and management of sepsis/septic shock.  Assessment & Plan:  Severe sepsis with septic shock:  Resolved -In the setting of UTI.  UA is positive for leukocyte, WBC and bacteria.  Upon admission: Patient febrile with fever of 103.5, tachycardia, hypotension, elevated lactic acid and leukocytosis of 13,000. -Patient started on IV fluids, broad-spectrum antibiotics vancomycin and cefepime in ED-transitioned to PO Augmentin on 11/12 -Urine culture positive for E. coli-pansensitive.  Continue Augmentin -BP improving-she is off of pressors  -Appreciate PCCM help. -Procalcitonin: Elevated at 26.53--> 31.92--> 24.12--> 11.15.  Remained afebrile overnight. -Blood culture is negative till now.   Nonocclusive left lower extremity DVT: -History of DVT in the past with history of underlying antiphospholipid antibody syndrome -On Coumadin at home (due to insurance issues). -Discussed with pharmacy-placed  pharmacy consult for Coumadin-continue to bridge with IV heparin until INR is therapeutic.  Acute hypoxemic respiratory failure: Resolved -Secondary to bilateral atelectasis. -Reviewed CTA chest.  No PE.  No pneumonia, no fluid overload.  COVID-19 negative.  -Continue chest PT. -Patient is maintaining oxygen saturation on room air.  Acute metabolic encephalopathy: Resolved -Likely in the setting of severe sepsis with underlying history of dementia.  On multiple AEDs -Passed bedside swallow evaluation.  Continue with PT/OT. -Fall/aspiration precautions  Macrocytic anemia -H&H dropped from 11.6-8.7--> 8.0-->7.8.   -B12, folate, TSH: WNL.  Iron studies: Iron: 58, TIBC: 158, ferritin elevated (likely secondary to underlying infection).   -Repeat CBC this afternoon.  Check fecal occult blood.  Monitor H&H closely and transfuse as needed.  AKI: Resolved   Hypomagnesemia: Replenished  Hypophosphatemia: Replenished  Hypokalemia: Replenished  Hypercalcemia: In the setting of hyperparathyroidism. -Calcium level is improving.  PTH is elevated-83.  -Patient was given calcitonin 300 units every 12 hours for 4 doses  History of seizure disorder: -Continue home dose of p.o. phenobarbital and phenytoin  Dementia: Improved.  Continue to monitor.  Supportive care.  Mild LFT elevation: -Resolved  DVT prophylaxis: Full dose heparin Code Status: partial code (no CPR, no intubation-okay with everything else)-confirmed with the patient Family Communication: None present at bedside.  Plan of care discussed with patient in length and she verbalized understanding and agreed with it.  Disposition Plan: SNF possible tomorrow Consultants:   PCCM  Procedures:  CT abdomen/pelvis with contrast CTA chest Doppler ultrasound of left lower extremity  Antimicrobials:   Cefepime  Vancomycin  Augmentin  Status is: Inpatient  Dispo: The patient is from: SNF-PT resources              Anticipated  d/c is to: SNF              Anticipated d/c date is: 12/03/2019              Patient currently not medically stable for the discharge   Subjective: Patient seen and examined.  Tells me that she continues to have leg pain.  Denies any other symptoms such as chest pain, shortness of breath, palpitation, fever, chills, nausea or vomiting.  Objective: Vitals:   12/01/19 2114 12/01/19 2349 12/02/19 0404 12/02/19 0725  BP: (!) 116/50 105/66 (!) 108/58 (!) 101/49  Pulse: 67 77 72 82  Resp: 18 18 16 17   Temp: 97.9 F (36.6 C) 98.6 F (37 C) 98.6 F (37 C) 99 F (37.2 C)  TempSrc: Oral Oral Oral Oral  SpO2: 95%   97%  Weight:      Height:        Intake/Output Summary (Last 24 hours) at 12/02/2019 1058 Last data filed at 12/02/2019 1006 Gross per 24 hour  Intake 560 ml  Output 2550 ml  Net -1990 ml   Filed Weights   11/28/19 1837 11/29/19 1751 12/01/19 0128  Weight: 74.8 kg 82.3 kg 75.5 kg    Examination:  General exam: Appears calm and comfortable, on room air, communicating well Respiratory system: Clear to auscultation. Respiratory effort normal. Cardiovascular system: S1 & S2 heard, RRR. No JVD, murmurs, rubs, gallops or clicks.  Left leg is slightly more swollen as compared to the right leg. Gastrointestinal system: Abdomen is nondistended, soft and nontender. No organomegaly or masses felt. Normal bowel sounds heard. Central nervous system: Alert and oriented. No focal neurological deficits. Extremities: Symmetric 5 x 5 power. Skin: No rashes, lesions or ulcers. Psychiatry: Judgement and insight appear normal. Mood & affect appropriate.   Data Reviewed: I have personally reviewed following labs and imaging studies  CBC: Recent Labs  Lab 11/28/19 1836 11/28/19 1836 11/29/19 0810 11/30/19 0148 11/30/19 1241 12/01/19 0558 12/02/19 0631  WBC 8.2   < > 13.0* 8.3 7.7 5.8 4.0  NEUTROABS 7.7  --  11.9*  --   --   --   --   HGB 11.6*   < > 8.7* 8.0* 8.7* 8.0* 7.8*    HCT 35.8*   < > 25.2* 23.9* 24.8* 23.4* 23.1*  MCV 105.6*   < > 100.4* 102.6* 101.2* 100.4* 102.2*  PLT 367   < > 242 207 215 206 197   < > = values in this interval not displayed.   Basic Metabolic Panel: Recent Labs  Lab 11/28/19 1836 11/28/19 1836 11/28/19 2322 11/28/19 2322 11/29/19 0320 11/29/19 0810 11/30/19 0148 11/30/19 1241 11/30/19 2050 12/01/19 0558 12/01/19 1148 12/01/19 2208 12/02/19 0631  NA 137  --   --   --  135  --  135  --   --  136  --   --  138  K 4.7  --   --   --  3.6  --  3.5  --   --  3.0*  --   --  3.2*  CL 101  --   --   --  106  --  109  --   --  112*  --   --  112*  CO2 19*  --   --   --  20*  --  20*  --   --  19*  --   --  20*  GLUCOSE 94  --   --   --  149*  --  92  --   --  106*  --   --  123*  BUN 24*  --   --   --  22  --  21  --   --  12  --   --  6*  CREATININE 1.45*  --   --   --  1.49*  --  0.97  --   --  0.78  --   --  0.64  CALCIUM 12.2*   < > 11.1*   < > 10.4*   < > 8.7*   < > 8.2* 8.1*  8.1* 7.7* 8.0* 8.1*  8.0*  MG  --   --  1.4*  --  2.1  --  1.4*  --   --   --  1.4*  --  1.6*  PHOS  --   --  <1.0*  --  1.2*  --  2.8  --   --  1.5*  --   --  1.6*   < > = values in this interval not displayed.   GFR: Estimated Creatinine Clearance: 66.7 mL/min (by C-G formula based on SCr of 0.64 mg/dL). Liver Function Tests: Recent Labs  Lab 11/28/19 1836 11/29/19 0320 11/30/19 0148 12/01/19 0558  AST 87* 53* 40 26  ALT 43 30 25 19   ALKPHOS 145* 104 88 84  BILITOT 0.8 1.1 0.6 0.5  PROT 8.1 6.3* 6.0* 5.7*  ALBUMIN 3.7 2.7* 2.4* 2.3*   No results for input(s): LIPASE, AMYLASE in the last 168 hours. No results for input(s): AMMONIA in the last 168 hours. Coagulation Profile: Recent Labs  Lab 11/28/19 1836 12/01/19 1148 12/02/19 0631  INR 1.4* 1.7* 1.9*   Cardiac Enzymes: Recent Labs  Lab 11/28/19 2322  CKTOTAL 79   BNP (last 3 results) No results for input(s): PROBNP in the last 8760 hours. HbA1C: No results for input(s):  HGBA1C in the last 72 hours. CBG: Recent Labs  Lab 12/01/19 1651 12/01/19 2030 12/01/19 2348 12/02/19 0400 12/02/19 0727  GLUCAP 92 130* 110* 119* 122*   Lipid Profile: No results for input(s): CHOL, HDL, LDLCALC, TRIG, CHOLHDL, LDLDIRECT in the last 72 hours. Thyroid Function Tests: Recent Labs    11/30/19 1241  TSH 1.555   Anemia Panel: Recent Labs    11/30/19 1241  VITAMINB12 573  FOLATE 8.6  FERRITIN 432*  TIBC 158*  IRON 58   Sepsis Labs: Recent Labs  Lab 11/28/19 2322 11/29/19 0320 11/29/19 1546 11/29/19 1840 11/30/19 0148 12/01/19 0558  PROCALCITON 26.53  --  31.92  --  24.12 11.15  LATICACIDVEN 5.1* 2.4* 1.0 1.5  --   --     Recent Results (from the past 240 hour(s))  Blood Culture (routine x 2)     Status: None (Preliminary result)   Collection Time: 11/28/19  6:36 PM   Specimen: BLOOD  Result Value Ref Range Status   Specimen Description BLOOD BLOOD RIGHT HAND  Final   Special Requests   Final    BOTTLES DRAWN AEROBIC AND ANAEROBIC Blood Culture results may not be optimal due to an inadequate volume of blood received in culture bottles   Culture   Final    NO GROWTH 4 DAYS Performed at [facility], [address] [place], [place] [address]    Report Status PENDING  Incomplete  Urine culture     Status: Abnormal   Collection Time: 11/28/19  6:36 PM   Specimen: In/Out Cath Urine  Result Value Ref Range Status   Specimen Description   Final    IN/OUT CATH URINE Performed at [facility], [address] [place], [place] [address]    Special Requests   Final    NONE Performed at [facility], [address] [place], [place] [address]    Culture >=100,000 COLONIES/mL ESCHERICHIA COLI (A)  Final   Report Status 11/30/2019 FINAL  Final   Organism ID, Bacteria ESCHERICHIA COLI (A)  Final      Susceptibility   Escherichia coli - MIC*    AMPICILLIN 4 SENSITIVE Sensitive     CEFAZOLIN <=4 SENSITIVE Sensitive      CEFEPIME <=0.12 SENSITIVE Sensitive     CEFTRIAXONE <=0.25 SENSITIVE Sensitive     CIPROFLOXACIN <=0.25 SENSITIVE Sensitive     GENTAMICIN <=1 SENSITIVE Sensitive     IMIPENEM <=0.25 SENSITIVE Sensitive     NITROFURANTOIN <=16 SENSITIVE Sensitive     TRIMETH/SULFA <=20 SENSITIVE Sensitive     AMPICILLIN/SULBACTAM <=2 SENSITIVE Sensitive     PIP/TAZO <=4 SENSITIVE Sensitive     * >=100,000 COLONIES/mL ESCHERICHIA COLI  Blood Culture (routine x 2)     Status: None (Preliminary result)   Collection Time: 11/28/19  6:41 PM   Specimen: BLOOD  Result Value Ref Range Status   Specimen Description BLOOD BLOOD LEFT HAND  Final   Special Requests   Final    BOTTLES DRAWN AEROBIC AND ANAEROBIC Blood Culture results may not be optimal due to an inadequate volume of blood received in culture bottles   Culture   Final    NO GROWTH 4 DAYS Performed at [facility], [address] [place], [place] [address]    Report Status PENDING  Incomplete  Respiratory Panel by RT PCR (Flu A&B, Covid) - Nasopharyngeal Swab     Status: None   Collection Time: 11/28/19  6:42 PM   Specimen: Nasopharyngeal Swab  Result Value Ref Range Status   SARS Coronavirus 2 by RT PCR NEGATIVE NEGATIVE Final    Comment: (NOTE) SARS-CoV-2 target nucleic acids are NOT DETECTED.  The SARS-CoV-2 RNA is generally detectable in upper respiratoy specimens during the acute phase of infection. The lowest concentration of SARS-CoV-2 viral copies this assay can detect is 131 copies/mL. A negative result does not preclude SARS-Cov-2 infection and should not be used as the sole basis for treatment or other patient management decisions. A negative result may occur with  improper specimen collection/handling, submission of specimen other than nasopharyngeal swab, presence of viral mutation(s) within the areas targeted by this assay, and inadequate number of viral copies (<131 copies/mL). A negative result must be combined with  clinical observations, patient history, and epidemiological information. The expected result is Negative.  Fact Sheet for Patients:  https://www.moore.com/  Fact Sheet for Healthcare Providers:  https://www.young.biz/  This test is no t yet approved or cleared by the [place] FDA and  has been authorized for detection and/or diagnosis of SARS-CoV-2 by FDA under an Emergency Use Authorization (EUA). This EUA will remain  in effect (meaning this test can be used) for the duration of the COVID-19 declaration under Section 564(b)(1) of the Act, 21 U.S.C. section 360bbb-3(b)(1), unless the authorization is terminated or revoked sooner.     Influenza A by PCR NEGATIVE NEGATIVE Final   Influenza B by PCR NEGATIVE NEGATIVE Final    Comment: (NOTE) The Xpert Xpress SARS-CoV-2/FLU/RSV assay is intended as an aid in  the diagnosis of influenza from  Nasopharyngeal swab specimens and  should not be used as a sole basis for treatment. Nasal washings and  aspirates are unacceptable for Xpert Xpress SARS-CoV-2/FLU/RSV  testing.  Fact Sheet for Patients: https://www.moore.com/  Fact Sheet for Healthcare Providers: https://www.young.biz/  This test is not yet approved or cleared by the [place] FDA and  has been authorized for detection and/or diagnosis of SARS-CoV-2 by  FDA under an Emergency Use Authorization (EUA). This EUA will remain  in effect (meaning this test can be used) for the duration of the  Covid-19 declaration under Section 564(b)(1) of the Act, 21  U.S.C. section 360bbb-3(b)(1), unless the authorization is  terminated or revoked. Performed at [facility], [address] [place], [place] [address]   MRSA PCR Screening     Status: Abnormal   Collection Time: 11/29/19 10:46 AM   Specimen: Nasal Mucosa; Nasopharyngeal  Result Value Ref Range Status   MRSA by PCR POSITIVE (A)  NEGATIVE Final    Comment:        The GeneXpert MRSA Assay (FDA approved for NASAL specimens only), is one component of a comprehensive MRSA colonization surveillance program. It is not intended to diagnose MRSA infection nor to guide or monitor treatment for MRSA infections. RESULT CALLED TO, READ BACK BY AND VERIFIED WITH: Hyman Hopes 11/29/19 1210 KLW Performed at [facility], [address] [place], [place] [address]       Radiology Studies: No results found.  Scheduled Meds: . amoxicillin-clavulanate  1 tablet Oral Q12H  . Chlorhexidine Gluconate Cloth  6 each Topical Q0600  . cinacalcet  30 mg Oral BID WC  . feeding supplement  237 mL Oral BID BM  . mouth rinse  15 mL Mouth Rinse BID  . midodrine  10 mg Oral Once  . midodrine  10 mg Oral TID WC  . multivitamin with minerals  1 tablet Oral Daily  . mupirocin ointment  1 application Nasal BID  . phenobarbital  64.8 mg Oral BID  . phenytoin  200 mg Oral Daily   And  . phenytoin  100 mg Oral QHS  . potassium chloride  20 mEq Oral Q4H  . warfarin  6 mg Oral ONCE-1600  . Warfarin - Pharmacist Dosing Inpatient   Does not apply q1600   Continuous Infusions: . sodium chloride    . dextrose 5 % and 0.9% NaCl 150 mL/hr at 12/02/19 0840  . heparin 1,050 Units/hr (12/01/19 2105)  . magnesium sulfate bolus IVPB 4 g (12/02/19 0919)  . potassium PHOSPHATE IVPB (in mmol)       LOS: 4 days   Time spent: 40 minutes   Ellah Otte Estill Cotta, MD [facility]  If 7PM-7AM, please contact night-coverage [email or web address] 12/02/2019, 10:58 AM

## 2019-12-03 LAB — PHOSPHORUS: Phosphorus: 2.4 mg/dL — ABNORMAL LOW (ref 2.5–4.6)

## 2019-12-03 LAB — CBC
HCT: 25.9 % — ABNORMAL LOW (ref 36.0–46.0)
Hemoglobin: 8.9 g/dL — ABNORMAL LOW (ref 12.0–15.0)
MCH: 34.5 pg — ABNORMAL HIGH (ref 26.0–34.0)
MCHC: 34.4 g/dL (ref 30.0–36.0)
MCV: 100.4 fL — ABNORMAL HIGH (ref 80.0–100.0)
Platelets: 209 10*3/uL (ref 150–400)
RBC: 2.58 MIL/uL — ABNORMAL LOW (ref 3.87–5.11)
RDW: 13.2 % (ref 11.5–15.5)
WBC: 4.8 10*3/uL (ref 4.0–10.5)
nRBC: 0 % (ref 0.0–0.2)

## 2019-12-03 LAB — BASIC METABOLIC PANEL
Anion gap: 7 (ref 5–15)
BUN: <5 mg/dL — ABNORMAL LOW (ref 8–23)
CO2: 22 mmol/L (ref 22–32)
Calcium: 9 mg/dL (ref 8.9–10.3)
Chloride: 110 mmol/L (ref 98–111)
Creatinine, Ser: 0.68 mg/dL (ref 0.44–1.00)
GFR, Estimated: >60 mL/min (ref >60)
Glucose, Bld: 108 mg/dL — ABNORMAL HIGH (ref 70–99)
Potassium: 3.6 mmol/L (ref 3.5–5.1)
Sodium: 139 mmol/L (ref 135–145)

## 2019-12-03 LAB — CULTURE, BLOOD (ROUTINE X 2)
Culture: NO GROWTH
Culture: NO GROWTH

## 2019-12-03 LAB — PROTIME-INR
INR: 1.7 — ABNORMAL HIGH (ref 0.8–1.2)
Prothrombin Time: 19 seconds — ABNORMAL HIGH (ref 11.4–15.2)

## 2019-12-03 LAB — GLUCOSE, CAPILLARY
Glucose-Capillary: 104 mg/dL — ABNORMAL HIGH (ref 70–99)
Glucose-Capillary: 109 mg/dL — ABNORMAL HIGH (ref 70–99)
Glucose-Capillary: 111 mg/dL — ABNORMAL HIGH (ref 70–99)
Glucose-Capillary: 122 mg/dL — ABNORMAL HIGH (ref 70–99)
Glucose-Capillary: 95 mg/dL (ref 70–99)

## 2019-12-03 LAB — MAGNESIUM: Magnesium: 1.6 mg/dL — ABNORMAL LOW (ref 1.7–2.4)

## 2019-12-03 LAB — HEPARIN LEVEL (UNFRACTIONATED)
Heparin Unfractionated: 0.19 IU/mL — ABNORMAL LOW (ref 0.30–0.70)
Heparin Unfractionated: 0.58 IU/mL (ref 0.30–0.70)

## 2019-12-03 LAB — SARS CORONAVIRUS 2 BY RT PCR (HOSPITAL ORDER, PERFORMED IN ~~LOC~~ HOSPITAL LAB): SARS Coronavirus 2: NEGATIVE

## 2019-12-03 LAB — CALCIUM: Calcium: 9.2 mg/dL (ref 8.9–10.3)

## 2019-12-03 MED ORDER — AMOXICILLIN-POT CLAVULANATE 875-125 MG PO TABS: 1.0000 | ORAL_TABLET | Freq: Two times a day (BID) | ORAL | 0 refills | Status: AC

## 2019-12-03 MED ORDER — POTASSIUM PHOSPHATES 15 MMOLE/5ML IV SOLN
10.0000 mmol | Freq: Once | INTRAVENOUS | Status: AC
  Administered 2019-12-03: 10 mmol via INTRAVENOUS
  Filled 2019-12-03: qty 3.33

## 2019-12-03 MED ORDER — MAGNESIUM SULFATE 2 GM/50ML IV SOLN
2.0000 g | Freq: Once | INTRAVENOUS | Status: AC
  Administered 2019-12-03: 2 g via INTRAVENOUS
  Filled 2019-12-03: qty 50

## 2019-12-03 MED ORDER — WARFARIN SODIUM 7.5 MG PO TABS
7.5000 mg | ORAL_TABLET | Freq: Once | ORAL | Status: AC
  Administered 2019-12-03: 7.5 mg via ORAL
  Filled 2019-12-03: qty 1

## 2019-12-03 MED ORDER — ENOXAPARIN SODIUM 40 MG/0.4ML ~~LOC~~ SOLN: 70.0000 mg | Freq: Two times a day (BID) | SUBCUTANEOUS | 0 refills | Status: AC

## 2019-12-03 NOTE — Progress Notes (Signed)
Physical Therapy Treatment Patient Details Name: Shannon Kelley MRN: [id number] DOB: 1945-08-21 Today's Date: 12/03/2019    History of Present Illness Pt is a 74 y/o F with PMH that includes: hyperparathyroidism, seizure disorder-on phenobarbital and phenytoin, history of DVT secondary to underlying antiphospholipid antibody syndrome-on Coumadin at home, and dementia. Pt presented d/t shortness of breath and left lower extremity pain. Pt found to have sepsis and acute DVT on doppler and has been recieveing heparin.  MD assessment also includes: Acute hypoxemic respiratory failure, Acute metabolic encephalopathy, macrocytic anemia, AKI, Hypomagnesemia, Hypercalcemia, Prolonged QT interval, and mild LFT elevation.    PT Comments    Pt was pleasant and motivated to participate during the session with significant decrease in anxiety compared to previous session.  Pt's SpO2 and HR on room air remained WNL throughout the session with no adverse symptoms noted.  Pt overall required decreased physical assist with functional tasks this session but fatigued quickly, especially with gait, and required frequent short therapeutic rest breaks during the session.  Pt will benefit from PT services in a SNF setting upon discharge to safely address deficits listed in patient problem list for decreased caregiver assistance and eventual return to PLOF.     Follow Up Recommendations  SNF;Supervision/Assistance - 24 hour     Equipment Recommendations  Other (comment) (TBD at next venue of care)    Recommendations for Other Services       Precautions / Restrictions Precautions Precautions: Fall Restrictions Weight Bearing Restrictions: No Other Position/Activity Restrictions: Seizure disorder    Mobility  Bed Mobility Overal bed mobility: Modified Independent Bed Mobility: Supine to Sit           General bed mobility comments: Extra time and effort only  Transfers Overall transfer level:  Needs assistance Equipment used: Rolling walker (2 wheeled) Transfers: Sit to/from Stand Sit to Stand: From elevated surface;Min assist         General transfer comment: Min verbal cues for hand placement  Ambulation/Gait Ambulation/Gait assistance: Min guard Gait Distance (Feet): 4 Feet Assistive device: Rolling walker (2 wheeled) Gait Pattern/deviations: Step-through pattern;Decreased step length - right;Decreased step length - left;Trunk flexed Gait velocity: decreased   General Gait Details: Pt able to take several small steps at the EOB without physical assistance with fair stability   Stairs             Wheelchair Mobility    Modified Rankin (Stroke Patients Only)       Balance Overall balance assessment: Needs assistance   Sitting balance-Leahy Scale: Good Sitting balance - Comments: Improved static sitting balance with pt able to consistently maintain neutral position without assist   Standing balance support: Bilateral upper extremity supported;During functional activity Standing balance-Leahy Scale: Fair                              Cognition Arousal/Alertness: Awake/alert Behavior During Therapy: WFL for tasks assessed/performed Overall Cognitive Status: Within Functional Limits for tasks assessed                                        Exercises Total Joint Exercises Ankle Circles/Pumps: Strengthening;Both;10 reps;15 reps (with manual resistance) Quad Sets: Strengthening;Both;15 reps;10 reps Gluteal Sets: Strengthening;Both;10 reps;15 reps Hip ABduction/ADduction: AAROM;Both;10 reps Straight Leg Raises: AAROM;Both;10 reps Long Arc Quad: AROM;Strengthening;Both;10 reps;15 reps Knee Flexion: AROM;Strengthening;Both;10 reps;15 reps  Marching in Standing: AROM;Both;5 reps;Standing;Strengthening Other Exercises Other Exercises: Static sitting at the EOB for postural control, improved activity tolerance Other Exercises:  Static standing for improved activity tolerance    General Comments        Pertinent Vitals/Pain Pain Assessment: No/denies pain    Home Living                      Prior Function            PT Goals (current goals can now be found in the care plan section) Progress towards PT goals: Progressing toward goals    Frequency    Min 2X/week      PT Plan Current plan remains appropriate    Co-evaluation              AM-PAC PT "6 Clicks" Mobility   Outcome Measure  Help needed turning from your back to your side while in a flat bed without using bedrails?: A Little Help needed moving from lying on your back to sitting on the side of a flat bed without using bedrails?: A Little Help needed moving to and from a bed to a chair (including a wheelchair)?: A Little Help needed standing up from a chair using your arms (e.g., wheelchair or bedside chair)?: A Little Help needed to walk in hospital room?: A Lot Help needed climbing 3-5 steps with a railing? : A Lot 6 Click Score: 16    End of Session Equipment Utilized During Treatment: Gait belt Activity Tolerance: Patient tolerated treatment well Patient left: in chair;with call bell/phone within reach;with chair alarm set;with SCD's reapplied Nurse Communication: Mobility status;Other (comment) (patient requesting standard diet (food currently finely chopped)) PT Visit Diagnosis: Unsteadiness on feet (R26.81);Muscle weakness (generalized) (M62.81);Difficulty in walking, not elsewhere classified (R26.2)     Time: 7001-7494 PT Time Calculation (min) (ACUTE ONLY): 50 min  Charges:  $Gait Training: 8-22 mins $Therapeutic Exercise: 8-22 mins $Therapeutic Activity: 8-22 mins                     D. Scott Donte Lenzo PT, DPT 12/03/19, 12:10 PM

## 2019-12-03 NOTE — [place] FL2 (Signed)
[place] MEDICAID FL2 LEVEL OF CARE SCREENING TOOL     IDENTIFICATION  Patient Name: STATIA BURDICK Birthdate: [birth year] Sex: female Admission Date (Current Location): 11/28/2019  [facility] and [place] Number:      Facility and Address:  [facility], [address] [place], [place] [address]      Provider Number: [id number]  Attending Physician Name and Address:  Ollen Bowl, MD  Relative Name and Phone Number:  Lyman Speller [phone]    Current Level of Care: Hospital Recommended Level of Care: Skilled Nursing Facility Prior Approval Number:    Date Approved/Denied:   PASRR Number:    Discharge Plan: SNF    Current Diagnoses: Patient Active Problem List   Diagnosis Date Noted  . Pressure injury of skin 11/29/2019  . Severe sepsis (HCC) 11/28/2019  . Hypercalcemia 11/28/2019  . AKI (acute kidney injury) (HCC) 11/28/2019  . Acute lower UTI 11/28/2019  . Dementia with behavioral disturbance (HCC) 11/28/2019  . Hx of seizure disorder 11/28/2019  . Prolonged QT interval 11/28/2019  . Acute respiratory failure with hypoxia (HCC) 11/28/2019  . Sepsis secondary to UTI (HCC) 11/28/2019  . DVT (deep venous thrombosis) (HCC) 11/28/2019  . Acute metabolic encephalopathy 11/28/2019    Orientation RESPIRATION BLADDER Height & Weight     Self, Time, Situation, Place  Normal Continent Weight: 75.5 kg Height:  5\' 10"  (177.8 cm)  BEHAVIORAL SYMPTOMS/MOOD NEUROLOGICAL BOWEL NUTRITION STATUS      Continent Diet (Regular)  AMBULATORY STATUS COMMUNICATION OF NEEDS Skin   Extensive Assist Verbally PU Stage and Appropriate Care PU Stage 1 Dressing:  (foam dressing changed every 3 days or as needed for soilage)                     Personal Care Assistance Level of Assistance  Bathing, Feeding, Dressing Bathing Assistance: Limited assistance Feeding assistance: Independent Dressing Assistance: Limited assistance     Functional  Limitations Info  Sight, Hearing, Speech Sight Info: Adequate Hearing Info: Adequate Speech Info: Adequate    SPECIAL CARE FACTORS FREQUENCY  PT (By licensed PT), OT (By licensed OT)                    Contractures Contractures Info: Not present    Additional Factors Info  Code Status, Allergies Code Status Info: Partial Allergies Info: No known allergies           Current Medications (12/03/2019):  This is the current hospital active medication list Current Facility-Administered Medications  Medication Dose Route Frequency Provider Last Rate Last Admin  . 0.9 %  sodium chloride infusion  250 mL Intravenous Continuous 12/05/2019, NP      . acetaminophen (TYLENOL) tablet 650 mg  650 mg Oral Q6H PRN Doutova, Anastassia, MD       Or  . acetaminophen (TYLENOL) suppository 650 mg  650 mg Rectal Q6H PRN Eugenie Norrie, MD   650 mg at 11/29/19 0443  . amoxicillin-clavulanate (AUGMENTIN) 875-125 MG per tablet 1 tablet  1 tablet Oral Q12H 13/11/21, RPH   1 tablet at 12/03/19 0800  . Chlorhexidine Gluconate Cloth 2 % PADS 6 each  6 each Topical Q0600 Pahwani, Rinka R, MD   6 each at 11/30/19 2000  . cinacalcet (SENSIPAR) tablet 30 mg  30 mg Oral BID WC Pahwani, Rinka R, MD   30 mg at 12/03/19 0801  . dextrose 5 %-0.9 % sodium chloride infusion  Intravenous Continuous Harlon Ditty D, NP 150 mL/hr at 12/03/19 0601 New Bag at 12/03/19 0601  . feeding supplement (ENSURE ENLIVE / ENSURE PLUS) liquid 237 mL  237 mL Oral BID BM Pahwani, Rinka R, MD   237 mL at 12/01/19 1004  . heparin ADULT infusion 100 units/mL (25000 units/245mL sodium chloride 0.45%)  1,300 Units/hr Intravenous Continuous Valrie Hart A, RPH 13 mL/hr at 12/03/19 0558 1,300 Units/hr at 12/03/19 0558  . MEDLINE mouth rinse  15 mL Mouth Rinse BID Pahwani, Rinka R, MD   15 mL at 12/03/19 1202  . midodrine (PROAMATINE) tablet 10 mg  10 mg Oral Once Doutova, Anastassia, MD      . midodrine (PROAMATINE)  tablet 10 mg  10 mg Oral TID WC Aleskerov, Fuad, MD   10 mg at 12/03/19 1202  . multivitamin with minerals tablet 1 tablet  1 tablet Oral Daily Pahwani, Rinka R, MD   1 tablet at 12/03/19 0800  . mupirocin ointment (BACTROBAN) 2 % 1 application  1 application Nasal BID Pahwani, Kasandra Knudsen, MD   1 application at 12/03/19 0802  . PHENobarbital (LUMINAL) tablet 64.8 mg  64.8 mg Oral BID Pahwani, Rinka R, MD   64.8 mg at 12/03/19 0756  . phenytoin (DILANTIN) ER capsule 200 mg  200 mg Oral Daily Pahwani, Rinka R, MD   200 mg at 12/03/19 0926   And  . phenytoin (DILANTIN) ER capsule 100 mg  100 mg Oral QHS Pahwani, Rinka R, MD   100 mg at 12/02/19 2147  . sodium chloride (OCEAN) 0.65 % nasal spray 1 spray  1 spray Each Nare PRN Pahwani, Rinka R, MD      . traZODone (DESYREL) tablet 50 mg  50 mg Oral QHS PRN Judithe Modest, NP   50 mg at 11/30/19 2312  . warfarin (COUMADIN) tablet 7.5 mg  7.5 mg Oral ONCE-1600 Hallaji, Sheema M, RPH      . Warfarin - Pharmacist Dosing Inpatient   Does not apply q1600 Marty Heck, [facility]         Discharge Medications: Please see discharge summary for a list of discharge medications.  Relevant Imaging Results:  Relevant Lab Results:   Additional Information    Trenton Founds, RN

## 2019-12-03 NOTE — Progress Notes (Signed)
ANTICOAGULATION CONSULT NOTE  Pharmacy Consult for Heparin and Warfarin Indication: VTE treatment  No Known Allergies  Patient Measurements: Height: 5\' 10"  (177.8 cm) Weight: 75.5 kg (166 lb 7.2 oz) IBW/kg (Calculated) : 68.5 HEPARIN DW (KG): 82.3  Vital Signs: Temp: 98.5 F (36.9 C) (11/15 0450) BP: 121/57 (11/15 0450) Pulse Rate: 77 (11/15 0450)  Labs: Recent Labs    12/01/19 0558 12/01/19 0558 12/01/19 1148 12/01/19 1357 12/02/19 0631 12/02/19 0631 12/02/19 1210 12/03/19 0412  HGB 8.0*   < >  --   --  7.8*   < > 8.6* 8.9*  HCT 23.4*   < >  --   --  23.1*  --  25.3* 25.9*  PLT 206   < >  --   --  197  --  205 209  LABPROT  --   --  19.2*  --  20.7*  --   --  19.0*  INR  --   --  1.7*  --  1.9*  --   --  1.7*  HEPARINUNFRC 0.46   < >  --  0.52 0.30  --   --  0.19*  CREATININE 0.78  --   --   --  0.64  --   --  0.68   < > = values in this interval not displayed.   Estimated Creatinine Clearance: 66.7 mL/min (by C-G formula based on SCr of 0.68 mg/dL).  Medical History: Past Medical History:  Diagnosis Date  . Epilepsy (HCC)      Assessment: H/H downtrended, PLTs 12/05/19.  Initiating Heparin for VTE treatment.  Pt transitioned from subtherapeutic warfarin to Heparin infusion while inpatient for treatment of pre-existing non-acute PE visualized on imaging.  RE-start Warfarin 11/13:on Coumadin PTA. Left lower extremity Doppler ultrasound came back positive for DVT. Pt restarting warfarin with heparin bridge.  Pt on warfarin 7 mg PO daily per nursing home MAG; pt also on phenobarbital and phenytoin currently and PTA. Last warfarin dose 11/9 per med rec.   Baseline INR = 1.4, aPTT = < 24. Date TIME HL Dosing Rate 11/11 0810 0.54 1200 units/hr 11/11 1546 0.76  11/12 0148 0.85 1100 units/hr 11/12 1241 0.53 900 units/hr 11/12 2050 0.24 ----  11/13  0558 0.46 Therapeutic on 1050 units/hr 11/13   1357    0.52     Therapeutic on 1050 units/hr  INR 11/13  INR  1.7    Warfarin 7 mg 11/14  INR  1.9  DDI: phenobarb, augmentin  Goal of Therapy:  INR 2.0-3.0 Heparin level 0.3-0.7 units/ml Monitor platelets by anticoagulation protocol: Yes   Plan:  Heparin: 11/15 HL @ 0412 = 0.19. SUBtherapeutic.  CBC stable.  Will increase Heparin to 1300 units/hr units/hr. Will recheck HL in 8 hours and CBC daily while on heparin drip. Bridging with Warfarin  Warfarin: INR 1.9.  barely subtherapeutic after 1 dose of Warfarin. Will order Warfarin 6 mg x 1 tonight.  F/u INR in am.   12/15, PharmD Clinical Pharmacist 12/03/2019 5:50 AM

## 2019-12-03 NOTE — Progress Notes (Signed)
PHARMACY CONSULT NOTE - FOLLOW UP  Pharmacy Consult for Electrolyte Monitoring and Replacement   Recent Labs: Potassium (mmol/L)  Date Value  12/03/2019 3.6   Magnesium (mg/dL)  Date Value  54/36/0677 1.6 (L)   Calcium (mg/dL)  Date Value  03/40/3524 9.0   Albumin (g/dL)  Date Value  81/85/9093 2.3 (L)   Phosphorus (mg/dL)  Date Value  12/09/6242 2.4 (L)   Sodium (mmol/L)  Date Value  12/03/2019 139   Assessment: K 3.6   Mag 1.6  Phos 2.4  Scr 0.64   Ca 9.0  Alb 2.3  Corrected Ca 10.36  Goal of Therapy:  Mg WNL K >/= 4.0  Plan:  MD has ordered Potassium phosphate 10 mmol IV x1  And magnesium 2g IV.  No additional replacement warranted at this time.  F/U labs in am   Gardner Candle, PharmD, BCPS Clinical Pharmacist 12/03/2019 8:08 AM

## 2019-12-03 NOTE — Progress Notes (Addendum)
ANTICOAGULATION CONSULT NOTE  Pharmacy Consult for Heparin and Warfarin Indication: VTE treatment  No Known Allergies  Patient Measurements: Height: 5\' 10"  (177.8 cm) Weight: 75.5 kg (166 lb 7.2 oz) IBW/kg (Calculated) : 68.5 HEPARIN DW (KG): 82.3  Vital Signs: Temp: 98.6 F (37 C) (11/15 1159) Temp Source: Oral (11/15 1159) BP: 127/59 (11/15 1159) Pulse Rate: 66 (11/15 1159)  Labs: Recent Labs     0000 12/01/19 0558 12/01/19 1148 12/01/19 1357 12/02/19 0631 12/02/19 0631 12/02/19 1210 12/03/19 0412 12/03/19 1349  HGB   < > 8.0*  --   --  7.8*   < > 8.6* 8.9*  --   HCT   < > 23.4*  --   --  23.1*  --  25.3* 25.9*  --   PLT   < > 206  --   --  197  --  205 209  --   LABPROT  --   --  19.2*  --  20.7*  --   --  19.0*  --   INR  --   --  1.7*  --  1.9*  --   --  1.7*  --   HEPARINUNFRC  --  0.46  --    < > 0.30  --   --  0.19* 0.58  CREATININE  --  0.78  --   --  0.64  --   --  0.68  --    < > = values in this interval not displayed.   Estimated Creatinine Clearance: 66.7 mL/min (by C-G formula based on SCr of 0.68 mg/dL).  Medical History: Past Medical History:  Diagnosis Date  . Epilepsy (HCC)      Assessment: H/H downtrended, PLTs 12/05/19.  Initiating Heparin for VTE treatment.  Pt transitioned from subtherapeutic warfarin to Heparin infusion while inpatient for treatment of pre-existing non-acute PE visualized on imaging.  RE-start Warfarin 11/13:on Coumadin PTA. Left lower extremity Doppler ultrasound came back positive for DVT. Pt restarting warfarin with heparin bridge.  Pt on warfarin 7 mg PO daily per nursing home MAR; pt also on phenobarbital and phenytoin currently and PTA. Last warfarin dose 11/9 per med rec.   Baseline INR = 1.4, aPTT = < 24.  Date TIME HL Dosing Rate 11/11 0810 0.54 1200 units/hr 11/11 1546 0.76  11/12 0148 0.85 1100 units/hr 11/12 1241 0.53 900 units/hr 11/12 2050 0.24 ----  11/13  0558 0.46 Therapeutic on 1050  units/hr 11/13   1357    0.52     Therapeutic on 1050 units/hr 11/15 0412 0.19 Subtherapeutic. Increased to 1300 units/hr. 11/15   1349 0.58 Thearpeutic  WARFARIN DATE INR DOSE 11/13   1.7 Warfarin 7 mg 11/14 1.9 Warfarin 6mg  11/15 1.7   DDI: phenobarb, augmentin  Goal of Therapy:  INR 2.0-3.0 Heparin level 0.3-0.7 units/ml Monitor platelets by anticoagulation protocol: Yes   Plan:  Heparin: 11/15 HL @ 1349 = 0.58.  Therapeutic. CBC stable. Continue Heparin 1300 units/hr.  MD entered note for pt discharge today.  Will recheck HL @ 2200 for 2nd therapeutic level and CBC daily while on heparin drip should pt discharge be delayed.  Bridging with Warfarin  Warfarin: INR 1.7. Subtherapeutic. Warfarin 7 mg x 1 tonight ordered.  F/u INR in am.   12/15, PharmD, [facility] 12/03/2019 3:09 PM

## 2019-12-03 NOTE — Discharge Summary (Signed)
Physician Discharge Summary  Shannon Kelley WJX:[id number] DOB: 1945-05-14 DOA: 11/28/2019  PCP: Feliz Beam, MD  Admit date: 11/28/2019 Discharge date: 12/03/2019  Admitted From: SNF [facility]: [facility]  Recommendations for Outpatient Follow-up:  1. Follow-up with PCP in one 1 week 2. Bridge Coumadin with Lovenox 70 mg twice daily for 5 days. 3. Continue Coumadin 7 mg daily 4. Monitor INR closely 5. Monitor H&H and signs of bleeding 6. Continue PT/OT 7. Check CBC, BMP, magnesium and phosphorus level in 1 week.  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  Per EMS: Patient was noted to be febrile with fever of 102, was noted to be hypoxic in 70s and was placed on nonrebreather.  She was found to have hypotension was started on IV fluids.  Lactic acid elevated at 5.7.  Evidence of AKI with creatinine up to 1.54.  AST: 87, alkaline phosphatase: 145, INR: 1.4.  UA showing possible UTI.  Code sepsis initiated and patient was started on broad-spectrum antibiotics Vanco and cefepime.  Left lower extremity Doppler ultrasound came back positive for acute DVT.  Patient started on heparin GTT.  Patient admitted for further evaluation and management of sepsis/septic shock.  Severe sepsis with septic shock:  Resolved -In the setting of UTI.  UA is positive for leukocyte, WBC and bacteria.  Upon admission: Patient febrile with fever of 103.5, tachycardia, hypotension, elevated lactic acid and leukocytosis of 13,000. -Patient  started on IV fluids, broad-spectrum antibiotics vancomycin and cefepime in ED-transitioned to PO Augmentin on 11/12 -Urine culture positive for E. coli-pansensitive.  Continued Augmentin -BP improved- off of pressors  -Appreciated PCCM help. -Procalcitonin: Elevated at 26.53--> 31.92--> 24.12--> 11.15.  Remained afebrile -Blood culture is negative till now.  -She received IV ab vancomycin &  cefepime for 3 days and p.o. Augmentin for 3 days.  We will discharge her on Augmentin 875 mg twice daily for 1 more day to complete total of 7 days of antibiotic course  Nonocclusive left lower extremity DVT: -History of DVT in the past with history of underlying antiphospholipid antibody syndrome -On Coumadin at home (due to insurance issues). -At discharge: Discussed with pharmacy: Will bridge Coumadin with Lovenox 70 mg twice daily for 5 more days and continue Coumadin 7 mg daily  Acute hypoxemic respiratory failure: Resolved -Secondary to bilateral atelectasis. -Reviewed CTA chest.  No PE.  No pneumonia, no fluid overload.  COVID-19 negative.  -Continued chest PT. -Patient on room air on the day of discharge.  Acute metabolic encephalopathy: Resolved -Likely in the setting of severe sepsis with underlying history of dementia.  On multiple AEDs -Passed bedside swallow evaluation.  Continued with PT/OT-recommended SNF, supervision/assistance for 24 hours.  Macrocytic anemia -H&H dropped from 11.6-8.7--> 8.0-->7.8--> 8.6--> 8.9.    No signs of active bleeding noted. -B12, folate, TSH: WNL.  Iron studies: Iron: 58, TIBC: 158, ferritin elevated (likely secondary to underlying infection).   -Occult blood: Negative.  AKI: Resolved   Hypomagnesemia: Replenished  Hypophosphatemia: Replenished  Hypokalemia: Replenished and resolved  Hypercalcemia: In the setting of hyperparathyroidism. -Calcium level improved.  PTH is elevated-83.  -Patient was given calcitonin 300 units every 12 hours  for 4  doses.  History of seizure disorder: -Continued home dose of p.o. phenobarbital and phenytoin  Dementia:  supportive care  Mild LFT elevation: -Resolved  Discharge Diagnoses:  Severe sepsis with septic shock Nonocclusive left lower extremity DVT Acute hypoxemic respiratory failure Acute metabolic encephalopathy Macrocytic anemia AKI Hypomagnesemia Hypophosphatemia Hypokalemia Hypercalcemia History of seizure disorder Dementia Mild LFT elevation  Discharge Instructions  Discharge Instructions    Diet - low sodium heart healthy   Complete by: As directed    Discharge instructions   Complete by: As directed    Follow-up with PCP in one 1 week Bridge Coumadin with Lovenox 70 mg twice daily for 5 days. Continue Coumadin 7 mg daily Monitor INR closely Monitor H&H and signs of bleeding Continue PT/OT Check CBC, BMP, magnesium and phosphorus level in 1 week.   Increase activity slowly   Complete by: As directed    No dressing needed   Complete by: As directed      Allergies as of 12/03/2019   No Known Allergies     Medication List    STOP taking these medications   Cholecalciferol 25 MCG (1000 UT) capsule   hydrOXYzine 25 MG capsule Commonly known as: VISTARIL     TAKE these medications   acetaminophen 325 MG tablet Commonly known as: TYLENOL Take 2 tablets by mouth every 4 (four) hours as needed.   amoxicillin-clavulanate 875-125 MG tablet Commonly known as: AUGMENTIN Take 1 tablet by mouth every 12 (twelve) hours for 1 day.   cinacalcet 30 MG tablet Commonly known as: SENSIPAR Take 30 mg by mouth 2 (two) times daily with a meal.   enoxaparin 40 MG/0.4ML injection Commonly known as: LOVENOX Inject 0.7 mLs (70 mg total) into the skin every 12 (twelve) hours for 5 days.   hydrOXYzine 25 MG tablet Commonly known as: ATARAX/VISTARIL Take 25 mg by mouth every 8 (eight) hours as needed for anxiety.   lidocaine 5 % Commonly known as:  LIDODERM Place 2 patches onto the skin daily. Remove & Discard patch within 12 hours or as directed by MD (Apply to lower back every morning)   magnesium oxide 400 MG tablet Commonly known as: MAG-OX Take 400 mg by mouth in the morning, at noon, and at bedtime.   midodrine 10 MG tablet Commonly known as: PROAMATINE Take 10 mg by mouth 3 (three) times daily.   PHENobarbital 64.8 MG tablet Commonly known as: LUMINAL Take 64.8 mg by mouth 2 (two) times daily.   phenytoin 100 MG ER capsule Commonly known as: DILANTIN Take 100-200 mg by mouth 2 (two) times daily. Take 2 capsules (200mg ) q day and 1 capsule (100mg ) qhs   polyethylene glycol 17 g packet Commonly known as: MIRALAX / GLYCOLAX Take 17 g by mouth daily as needed.   QUEtiapine 25 MG tablet Commonly known as: SEROQUEL Take 25 mg by mouth at bedtime.   sertraline 50 MG tablet Commonly known as: ZOLOFT Take 50 mg by mouth daily.   Thera-M Tabs Take 1 tablet by mouth daily.   warfarin 5 MG tablet Commonly known as: COUMADIN Take 7 mg by mouth daily.            Discharge Care Instructions  (From admission, onward)         Start     Ordered   12/03/19 0000  No dressing needed        12/03/19 1317          Follow-up Information    Chelminski,  Renae Fickle, MD Follow up in 1 week(s).   Specialty: Internal Medicine Contact information: [id number] [facility] [place] [address] [phone]              No Known Allergies  Consultations:  PCCM   Procedures/Studies: CT Angio Chest PE W and/or Wo Contrast  Result Date: 11/28/2019 CLINICAL DATA:  Fever, short of breath, abdominal pain EXAM: CT ANGIOGRAPHY CHEST CT ABDOMEN AND PELVIS WITH CONTRAST TECHNIQUE: Multidetector CT imaging of the chest was performed using the standard protocol during bolus administration of intravenous contrast. Multiplanar CT image reconstructions and MIPs were obtained to evaluate the vascular  anatomy. Multidetector CT imaging of the abdomen and pelvis was performed using the standard protocol during bolus administration of intravenous contrast. CONTRAST:  60mL OMNIPAQUE IOHEXOL 350 MG/ML SOLN COMPARISON:  01/03/2009, 11/28/2019 FINDINGS: CTA CHEST FINDINGS Cardiovascular: This is a technically adequate evaluation of the pulmonary vasculature. There are 2 small eccentric filling defects within the left lower lobe segmental pulmonary arteries, which have an appearance most consistent with sequela of chronic thromboembolic disease. This is best seen on image 132 of series 5 and image 141 of series 5. No other signs of additional filling defects or acute pulmonary emboli. The heart is unremarkable without pericardial effusion. Mediastinum/Nodes: No enlarged mediastinal, hilar, or axillary lymph nodes. Thyroid gland, trachea, and esophagus demonstrate no significant findings. Lungs/Pleura: No acute airspace disease, effusion, or pneumothorax. Scattered hypoventilatory changes versus scarring at the lung bases. Central airways are patent. Musculoskeletal: No acute displaced fracture. Sclerosis and invagination of the superior endplate of T11 consistent with chronic compression fracture. Reconstructed images demonstrate no additional findings. Review of the MIP images confirms the above findings. CT ABDOMEN and PELVIS FINDINGS Hepatobiliary: No focal liver abnormality is seen. Noncalcified gallstones seen within the gallbladder neck, best visualized on delayed images. No gallbladder wall thickening or signs of acute cholecystitis. Pancreas: Unremarkable. No pancreatic ductal dilatation or surrounding inflammatory changes. Spleen: Normal in size without focal abnormality. Adrenals/Urinary Tract: Small left renal cortical cysts are noted. Mild bilateral renal cortical atrophy. Punctate calcifications are seen at the renal hila, likely vascular. No definite urinary tract calculi or obstructive uropathy. The  adrenals and bladder are unremarkable. Stomach/Bowel: No bowel obstruction or ileus. Normal appendix right lower quadrant. No bowel wall thickening or inflammatory change. Vascular/Lymphatic: Aortic atherosclerosis. No enlarged abdominal or pelvic lymph nodes. Reproductive: Uterus and bilateral adnexa are unremarkable. Other: No free fluid or free gas. No abdominal wall hernia. Musculoskeletal: Chronic appearing compression deformities are seen at T11 and L1. There are no acute displaced fractures. Bilateral hip arthroplasties are unremarkable. Reconstructed images demonstrate no additional findings. Review of the MIP images confirms the above findings. IMPRESSION: 1. Small eccentric filling defects within the left lower lobe segmental pulmonary arteries, which have an appearance most consistent with sequela of chronic thromboembolic disease. No signs of acute pulmonary emboli. 2. Noncalcified gallstone.  No evidence of acute cholecystitis. 3. Aortic Atherosclerosis (ICD10-I70.0). Electronically Signed   By: Sharlet Salina M.D.   On: 11/28/2019 20:21   CT ABDOMEN PELVIS W CONTRAST  Result Date: 11/28/2019 CLINICAL DATA:  Fever, short of breath, abdominal pain EXAM: CT ANGIOGRAPHY CHEST CT ABDOMEN AND PELVIS WITH CONTRAST TECHNIQUE: Multidetector CT imaging of the chest was performed using the standard protocol during bolus administration of intravenous contrast. Multiplanar CT image reconstructions and MIPs were obtained to evaluate the vascular anatomy. Multidetector CT imaging of the abdomen and pelvis was performed using the standard  protocol during bolus administration of intravenous contrast. CONTRAST:  60mL OMNIPAQUE IOHEXOL 350 MG/ML SOLN COMPARISON:  01/03/2009, 11/28/2019 FINDINGS: CTA CHEST FINDINGS Cardiovascular: This is a technically adequate evaluation of the pulmonary vasculature. There are 2 small eccentric filling defects within the left lower lobe segmental pulmonary arteries, which have an  appearance most consistent with sequela of chronic thromboembolic disease. This is best seen on image 132 of series 5 and image 141 of series 5. No other signs of additional filling defects or acute pulmonary emboli. The heart is unremarkable without pericardial effusion. Mediastinum/Nodes: No enlarged mediastinal, hilar, or axillary lymph nodes. Thyroid gland, trachea, and esophagus demonstrate no significant findings. Lungs/Pleura: No acute airspace disease, effusion, or pneumothorax. Scattered hypoventilatory changes versus scarring at the lung bases. Central airways are patent. Musculoskeletal: No acute displaced fracture. Sclerosis and invagination of the superior endplate of T11 consistent with chronic compression fracture. Reconstructed images demonstrate no additional findings. Review of the MIP images confirms the above findings. CT ABDOMEN and PELVIS FINDINGS Hepatobiliary: No focal liver abnormality is seen. Noncalcified gallstones seen within the gallbladder neck, best visualized on delayed images. No gallbladder wall thickening or signs of acute cholecystitis. Pancreas: Unremarkable. No pancreatic ductal dilatation or surrounding inflammatory changes. Spleen: Normal in size without focal abnormality. Adrenals/Urinary Tract: Small left renal cortical cysts are noted. Mild bilateral renal cortical atrophy. Punctate calcifications are seen at the renal hila, likely vascular. No definite urinary tract calculi or obstructive uropathy. The adrenals and bladder are unremarkable. Stomach/Bowel: No bowel obstruction or ileus. Normal appendix right lower quadrant. No bowel wall thickening or inflammatory change. Vascular/Lymphatic: Aortic atherosclerosis. No enlarged abdominal or pelvic lymph nodes. Reproductive: Uterus and bilateral adnexa are unremarkable. Other: No free fluid or free gas. No abdominal wall hernia. Musculoskeletal: Chronic appearing compression deformities are seen at T11 and L1. There are no  acute displaced fractures. Bilateral hip arthroplasties are unremarkable. Reconstructed images demonstrate no additional findings. Review of the MIP images confirms the above findings. IMPRESSION: 1. Small eccentric filling defects within the left lower lobe segmental pulmonary arteries, which have an appearance most consistent with sequela of chronic thromboembolic disease. No signs of acute pulmonary emboli. 2. Noncalcified gallstone.  No evidence of acute cholecystitis. 3. Aortic Atherosclerosis (ICD10-I70.0). Electronically Signed   By: Sharlet Salina M.D.   On: 11/28/2019 20:21   US Venous Img Lower Unilateral Left  Result Date: 11/28/2019 CLINICAL DATA:  Leg pain EXAM: LEFT LOWER EXTREMITY VENOUS DOPPLER ULTRASOUND TECHNIQUE: Gray-scale sonography with compression, as well as color and duplex ultrasound, were performed to evaluate the deep venous system(s) from the level of the common femoral vein through the popliteal and proximal calf veins. COMPARISON:  None. FINDINGS: VENOUS Nonocclusive thrombus is noted throughout the left common femoral vein, femoral vein, and popliteal vein. The waveforms within the femoral vein and common femoral vein are unremarkable. There is decreased phasicity within the popliteal vein consistent with a more proximal thrombus. The tibial vasculature was not well visualized on this study. Limited views of the contralateral common femoral vein are unremarkable. OTHER None. Limitations: none IMPRESSION: Nonocclusive DVT throughout the left lower extremity as detailed above. Electronically Signed   By: Katherine Mantle M.D.   On: 11/28/2019 23:18   DG Chest Port 1 View  Result Date: 11/28/2019 CLINICAL DATA:  Questionable sepsis.  Evaluate for pneumonia. EXAM: PORTABLE CHEST 1 VIEW COMPARISON:  03/30/2017 FINDINGS: The stable cardiomediastinal contours. No pleural effusion identified. No airspace consolidation identified to suggest pneumonia. Healing left  distal clavicle  fracture. IMPRESSION: No active disease. Electronically Signed   By: Signa Kell M.D.   On: 11/28/2019 19:31      Subjective: Patient seen and examined.  Resting comfortably on the bed.  Tells me that she feels much better, requested for compression stockings.  No new complaints.  Tells me that she is comfortable going to SNF today.  Discharge Exam: Vitals:   12/03/19 0744 12/03/19 1159  BP: 126/67 (!) 127/59  Pulse: 96 66  Resp: 16 16  Temp: 98.2 F (36.8 C) 98.6 F (37 C)  SpO2: 97% 98%   Vitals:   12/02/19 2339 12/03/19 0450 12/03/19 0744 12/03/19 1159  BP: 132/61 (!) 121/57 126/67 (!) 127/59  Pulse: 81 77 96 66  Resp: 16 15 16 16   Temp: 98.9 F (37.2 C) 98.5 F (36.9 C) 98.2 F (36.8 C) 98.6 F (37 C)  TempSrc:   Oral Oral  SpO2: 99% 93% 97% 98%  Weight:      Height:        General: Pt is alert, awake, not in acute distress, on room air, communicating well cardiovascular: RRR, S1/S2 +, no rubs, no gallops Respiratory: CTA bilaterally, no wheezing, no rhonchi Abdominal: Soft, NT, ND, bowel sounds + Extremities: no edema, no cyanosis    The results of significant diagnostics from this hospitalization (including imaging, microbiology, ancillary and laboratory) are listed below for reference.     Microbiology: Recent Results (from the past 240 hour(s))  Blood Culture (routine x 2)     Status: None   Collection Time: 11/28/19  6:36 PM   Specimen: BLOOD  Result Value Ref Range Status   Specimen Description BLOOD BLOOD RIGHT HAND  Final   Special Requests   Final    BOTTLES DRAWN AEROBIC AND ANAEROBIC Blood Culture results may not be optimal due to an inadequate volume of blood received in culture bottles   Culture   Final    NO GROWTH 5 DAYS Performed at [facility], [address] [place], [place]    Report Status 12/03/2019 FINAL  Final  Urine culture     Status: Abnormal   Collection Time: 11/28/19  6:36 PM   Specimen: In/Out Cath  Urine  Result Value Ref Range Status   Specimen Description   Final    IN/OUT CATH URINE Performed at [facility], [address] [place], [place]    Special Requests   Final    NONE Performed at [facility], [address] [place], [place]    Culture >=100,000 COLONIES/mL ESCHERICHIA COLI (A)  Final   Report Status 11/30/2019 FINAL  Final   Organism ID, Bacteria ESCHERICHIA COLI (A)  Final      Susceptibility   Escherichia coli - MIC*    AMPICILLIN 4 SENSITIVE Sensitive     CEFAZOLIN <=4 SENSITIVE Sensitive     CEFEPIME <=0.12 SENSITIVE Sensitive     CEFTRIAXONE <=0.25 SENSITIVE Sensitive     CIPROFLOXACIN <=0.25 SENSITIVE Sensitive     GENTAMICIN <=1 SENSITIVE Sensitive     IMIPENEM <=0.25 SENSITIVE Sensitive     NITROFURANTOIN <=16 SENSITIVE Sensitive     TRIMETH/SULFA <=20 SENSITIVE Sensitive     AMPICILLIN/SULBACTAM <=2 SENSITIVE Sensitive     PIP/TAZO <=4 SENSITIVE Sensitive     * >=100,000 COLONIES/mL ESCHERICHIA COLI  Blood Culture (routine x 2)     Status: None   Collection Time: 11/28/19  6:41 PM  Specimen: BLOOD  Result Value Ref Range Status   Specimen Description BLOOD BLOOD LEFT HAND  Final   Special Requests   Final    BOTTLES DRAWN AEROBIC AND ANAEROBIC Blood Culture results may not be optimal due to an inadequate volume of blood received in culture bottles   Culture   Final    NO GROWTH 5 DAYS Performed at [facility], [address], [place], [place] [address]    Report Status 12/03/2019 FINAL  Final  Respiratory Panel by RT PCR (Flu A&B, Covid) - Nasopharyngeal Swab     Status: None   Collection Time: 11/28/19  6:42 PM   Specimen: Nasopharyngeal Swab  Result Value Ref Range Status   SARS Coronavirus 2 by RT PCR NEGATIVE NEGATIVE Final    Comment: (NOTE) SARS-CoV-2 target nucleic acids are NOT DETECTED.  The SARS-CoV-2 RNA is generally detectable in upper respiratoy specimens during the acute  phase of infection. The lowest concentration of SARS-CoV-2 viral copies this assay can detect is 131 copies/mL. A negative result does not preclude SARS-Cov-2 infection and should not be used as the sole basis for treatment or other patient management decisions. A negative result may occur with  improper specimen collection/handling, submission of specimen other than nasopharyngeal swab, presence of viral mutation(s) within the areas targeted by this assay, and inadequate number of viral copies (<131 copies/mL). A negative result must be combined with clinical observations, patient history, and epidemiological information. The expected result is Negative.  Fact Sheet for Patients:  https://www.moore.com/  Fact Sheet for Healthcare Providers:  https://www.young.biz/  This test is no t yet approved or cleared by the [place] FDA and  has been authorized for detection and/or diagnosis of SARS-CoV-2 by FDA under an Emergency Use Authorization (EUA). This EUA will remain  in effect (meaning this test can be used) for the duration of the COVID-19 declaration under Section 564(b)(1) of the Act, 21 U.S.C. section 360bbb-3(b)(1), unless the authorization is terminated or revoked sooner.     Influenza A by PCR NEGATIVE NEGATIVE Final   Influenza B by PCR NEGATIVE NEGATIVE Final    Comment: (NOTE) The Xpert Xpress SARS-CoV-2/FLU/RSV assay is intended as an aid in  the diagnosis of influenza from Nasopharyngeal swab specimens and  should not be used as a sole basis for treatment. Nasal washings and  aspirates are unacceptable for Xpert Xpress SARS-CoV-2/FLU/RSV  testing.  Fact Sheet for Patients: https://www.moore.com/  Fact Sheet for Healthcare Providers: https://www.young.biz/  This test is not yet approved or cleared by the [place] FDA and  has been authorized for detection and/or diagnosis of  SARS-CoV-2 by  FDA under an Emergency Use Authorization (EUA). This EUA will remain  in effect (meaning this test can be used) for the duration of the  Covid-19 declaration under Section 564(b)(1) of the Act, 21  U.S.C. section 360bbb-3(b)(1), unless the authorization is  terminated or revoked. Performed at [facility], [address] [place], [place] [address]   MRSA PCR Screening     Status: Abnormal   Collection Time: 11/29/19 10:46 AM   Specimen: Nasal Mucosa; Nasopharyngeal  Result Value Ref Range Status   MRSA by PCR POSITIVE (A) NEGATIVE Final    Comment:        The GeneXpert MRSA Assay (FDA approved for NASAL specimens only), is one component of a comprehensive MRSA colonization surveillance program. It is not intended to diagnose MRSA infection nor to guide or monitor treatment for MRSA infections.  RESULT CALLED TO, READ BACK BY AND VERIFIED WITH: Hyman Hopes 11/29/19 1210 KLW Performed at [facility], [address] [place], [place] [address]      Labs: BNP (last 3 results) No results for input(s): BNP in the last 8760 hours. Basic Metabolic Panel: Recent Labs  Lab 11/29/19 0320 11/29/19 0810 11/30/19 0148 11/30/19 1241 12/01/19 0558 12/01/19 0558 12/01/19 1148 12/01/19 2208 12/02/19 0631 12/02/19 1210 12/02/19 1958 12/03/19 0412 12/03/19 1241  NA 135  --  135  --  136  --   --   --  138  --   --  139  --   K 3.6  --  3.5  --  3.0*  --   --   --  3.2*  --   --  3.6  --   CL 106  --  109  --  112*  --   --   --  112*  --   --  110  --   CO2 20*  --  20*  --  19*  --   --   --  20*  --   --  22  --   GLUCOSE 149*  --  92  --  106*  --   --   --  123*  --   --  108*  --   BUN 22  --  21  --  12  --   --   --  6*  --   --  <5*  --   CREATININE 1.49*  --  0.97  --  0.78  --   --   --  0.64  --   --  0.68  --   CALCIUM 10.4*   < > 8.7*   < > 8.1*  8.1*   < > 7.7*   < > 8.1*  8.0* 8.3* 8.7* 9.0 9.2  MG 2.1  --  1.4*  --   --   --   1.4*  --  1.6*  --   --  1.6*  --   PHOS 1.2*  --  2.8  --  1.5*  --   --   --  1.6*  --   --  2.4*  --    < > = values in this interval not displayed.   Liver Function Tests: Recent Labs  Lab 11/28/19 1836 11/29/19 0320 11/30/19 0148 12/01/19 0558  AST 87* 53* 40 26  ALT 43 30 25 19   ALKPHOS 145* 104 88 84  BILITOT 0.8 1.1 0.6 0.5  PROT 8.1 6.3* 6.0* 5.7*  ALBUMIN 3.7 2.7* 2.4* 2.3*   No results for input(s): LIPASE, AMYLASE in the last 168 hours. No results for input(s): AMMONIA in the last 168 hours. CBC: Recent Labs  Lab 11/28/19 1836 11/28/19 1836 11/29/19 0810 11/30/19 0148 11/30/19 1241 12/01/19 0558 12/02/19 0631 12/02/19 1210 12/03/19 0412  WBC 8.2   < > 13.0*   < > 7.7 5.8 4.0 4.5 4.8  NEUTROABS 7.7  --  11.9*  --   --   --   --   --   --   HGB 11.6*   < > 8.7*   < > 8.7* 8.0* 7.8* 8.6* 8.9*  HCT 35.8*   < > 25.2*   < > 24.8* 23.4* 23.1* 25.3* 25.9*  MCV 105.6*   < > 100.4*   < > 101.2* 100.4* 102.2* 101.6* 100.4*  PLT 367   < >  242   < > 215 206 197 205 209   < > = values in this interval not displayed.   Cardiac Enzymes: Recent Labs  Lab 11/28/19 2322  CKTOTAL 79   BNP: Invalid input(s): POCBNP CBG: Recent Labs  Lab 12/02/19 2015 12/03/19 0044 12/03/19 0448 12/03/19 0744 12/03/19 1228  GLUCAP 95 109* 111* 122* 104*   D-Dimer No results for input(s): DDIMER in the last 72 hours. Hgb A1c No results for input(s): HGBA1C in the last 72 hours. Lipid Profile No results for input(s): CHOL, HDL, LDLCALC, TRIG, CHOLHDL, LDLDIRECT in the last 72 hours. Thyroid function studies No results for input(s): TSH, T4TOTAL, T3FREE, THYROIDAB in the last 72 hours.  Invalid input(s): FREET3 Anemia work up No results for input(s): VITAMINB12, FOLATE, FERRITIN, TIBC, IRON, RETICCTPCT in the last 72 hours. Urinalysis    Component Value Date/Time   COLORURINE YELLOW (A) 11/28/2019 1836   APPEARANCEUR TURBID (A) 11/28/2019 1836   LABSPEC 1.012 11/28/2019  1836   PHURINE 6.0 11/28/2019 1836   GLUCOSEU NEGATIVE 11/28/2019 1836   HGBUR MODERATE (A) 11/28/2019 1836   BILIRUBINUR NEGATIVE 11/28/2019 1836   KETONESUR NEGATIVE 11/28/2019 1836   PROTEINUR 100 (A) 11/28/2019 1836   NITRITE NEGATIVE 11/28/2019 1836   LEUKOCYTESUR MODERATE (A) 11/28/2019 1836   Sepsis Labs Invalid input(s): PROCALCITONIN,  WBC,  LACTICIDVEN Microbiology Recent Results (from the past 240 hour(s))  Blood Culture (routine x 2)     Status: None   Collection Time: 11/28/19  6:36 PM   Specimen: BLOOD  Result Value Ref Range Status   Specimen Description BLOOD BLOOD RIGHT HAND  Final   Special Requests   Final    BOTTLES DRAWN AEROBIC AND ANAEROBIC Blood Culture results may not be optimal due to an inadequate volume of blood received in culture bottles   Culture   Final    NO GROWTH 5 DAYS Performed at [facility], [address] [place], [place] [address]    Report Status 12/03/2019 FINAL  Final  Urine culture     Status: Abnormal   Collection Time: 11/28/19  6:36 PM   Specimen: In/Out Cath Urine  Result Value Ref Range Status   Specimen Description   Final    IN/OUT CATH URINE Performed at [facility], [address] [place], [place] [address]    Special Requests   Final    NONE Performed at [facility], [address] [place], [place] [address]    Culture >=100,000 COLONIES/mL ESCHERICHIA COLI (A)  Final   Report Status 11/30/2019 FINAL  Final   Organism ID, Bacteria ESCHERICHIA COLI (A)  Final      Susceptibility   Escherichia coli - MIC*    AMPICILLIN 4 SENSITIVE Sensitive     CEFAZOLIN <=4 SENSITIVE Sensitive     CEFEPIME <=0.12 SENSITIVE Sensitive     CEFTRIAXONE <=0.25 SENSITIVE Sensitive     CIPROFLOXACIN <=0.25 SENSITIVE Sensitive     GENTAMICIN <=1 SENSITIVE Sensitive     IMIPENEM <=0.25 SENSITIVE Sensitive     NITROFURANTOIN <=16 SENSITIVE Sensitive     TRIMETH/SULFA <=20 SENSITIVE Sensitive      AMPICILLIN/SULBACTAM <=2 SENSITIVE Sensitive     PIP/TAZO <=4 SENSITIVE Sensitive     * >=100,000 COLONIES/mL ESCHERICHIA COLI  Blood Culture (routine x 2)     Status: None   Collection Time: 11/28/19  6:41 PM   Specimen: BLOOD  Result Value Ref Range Status   Specimen Description BLOOD BLOOD LEFT  HAND  Final   Special Requests   Final    BOTTLES DRAWN AEROBIC AND ANAEROBIC Blood Culture results may not be optimal due to an inadequate volume of blood received in culture bottles   Culture   Final    NO GROWTH 5 DAYS Performed at [facility], [address] [place], [place] [address]    Report Status 12/03/2019 FINAL  Final  Respiratory Panel by RT PCR (Flu A&B, Covid) - Nasopharyngeal Swab     Status: None   Collection Time: 11/28/19  6:42 PM   Specimen: Nasopharyngeal Swab  Result Value Ref Range Status   SARS Coronavirus 2 by RT PCR NEGATIVE NEGATIVE Final    Comment: (NOTE) SARS-CoV-2 target nucleic acids are NOT DETECTED.  The SARS-CoV-2 RNA is generally detectable in upper respiratoy specimens during the acute phase of infection. The lowest concentration of SARS-CoV-2 viral copies this assay can detect is 131 copies/mL. A negative result does not preclude SARS-Cov-2 infection and should not be used as the sole basis for treatment or other patient management decisions. A negative result may occur with  improper specimen collection/handling, submission of specimen other than nasopharyngeal swab, presence of viral mutation(s) within the areas targeted by this assay, and inadequate number of viral copies (<131 copies/mL). A negative result must be combined with clinical observations, patient history, and epidemiological information. The expected result is Negative.  Fact Sheet for Patients:  https://www.moore.com/https://www.fda.gov/media/142436/download  Fact Sheet for Healthcare Providers:  https://www.young.biz/https://www.fda.gov/media/142435/download  This test is no t yet approved or cleared by the  [place] FDA and  has been authorized for detection and/or diagnosis of SARS-CoV-2 by FDA under an Emergency Use Authorization (EUA). This EUA will remain  in effect (meaning this test can be used) for the duration of the COVID-19 declaration under Section 564(b)(1) of the Act, 21 U.S.C. section 360bbb-3(b)(1), unless the authorization is terminated or revoked sooner.     Influenza A by PCR NEGATIVE NEGATIVE Final   Influenza B by PCR NEGATIVE NEGATIVE Final    Comment: (NOTE) The Xpert Xpress SARS-CoV-2/FLU/RSV assay is intended as an aid in  the diagnosis of influenza from Nasopharyngeal swab specimens and  should not be used as a sole basis for treatment. Nasal washings and  aspirates are unacceptable for Xpert Xpress SARS-CoV-2/FLU/RSV  testing.  Fact Sheet for Patients: https://www.moore.com/https://www.fda.gov/media/142436/download  Fact Sheet for Healthcare Providers: https://www.young.biz/https://www.fda.gov/media/142435/download  This test is not yet approved or cleared by the [place] FDA and  has been authorized for detection and/or diagnosis of SARS-CoV-2 by  FDA under an Emergency Use Authorization (EUA). This EUA will remain  in effect (meaning this test can be used) for the duration of the  Covid-19 declaration under Section 564(b)(1) of the Act, 21  U.S.C. section 360bbb-3(b)(1), unless the authorization is  terminated or revoked. Performed at [facility], [address] [place], [place] [address]   MRSA PCR Screening     Status: Abnormal   Collection Time: 11/29/19 10:46 AM   Specimen: Nasal Mucosa; Nasopharyngeal  Result Value Ref Range Status   MRSA by PCR POSITIVE (A) NEGATIVE Final    Comment:        The GeneXpert MRSA Assay (FDA approved for NASAL specimens only), is one component of a comprehensive MRSA colonization surveillance program. It is not intended to diagnose MRSA infection nor to guide or monitor treatment for MRSA infections. RESULT CALLED TO, READ BACK BY  AND VERIFIED WITH: TAYLOR BECK 11/29/19 1210 KLW Performed  at [facility], [address], [place], [place] [address]      Time coordinating discharge: Over 30 minutes  SIGNED:   Ollen Bowl, MD  [facility] 12/03/2019, 1:20 PM Pager   If 7PM-7AM, please contact night-coverage [email or web address]

## 2019-12-03 NOTE — Care Management Important Message (Signed)
Important Message  Patient Details  Name: Shannon Kelley MRN: [id number] Date of Birth: 1945-08-18   Medicare Important Message Given:  Yes     Olegario Messier A Jeanpierre Thebeau 12/03/2019, 12:12 PM

## 2019-12-03 NOTE — Progress Notes (Signed)
Two unsuccessful attempts to start new PIV. Existing RAC line positional. Re-adjusted catheter and replaced tegaderm.

## 2019-12-03 NOTE — Progress Notes (Signed)
ANTICOAGULATION CONSULT NOTE  Pharmacy Consult for Heparin and Warfarin Indication: VTE treatment  No Known Allergies  Patient Measurements: Height: 5\' 10"  (177.8 cm) Weight: 75.5 kg (166 lb 7.2 oz) IBW/kg (Calculated) : 68.5 HEPARIN DW (KG): 82.3  Vital Signs: Temp: 98.2 F (36.8 C) (11/15 0744) Temp Source: Oral (11/15 0744) BP: 126/67 (11/15 0744) Pulse Rate: 96 (11/15 0744)  Labs: Recent Labs    12/01/19 0558 12/01/19 0558 12/01/19 1148 12/01/19 1357 12/02/19 0631 12/02/19 0631 12/02/19 1210 12/03/19 0412  HGB 8.0*   < >  --   --  7.8*   < > 8.6* 8.9*  HCT 23.4*   < >  --   --  23.1*  --  25.3* 25.9*  PLT 206   < >  --   --  197  --  205 209  LABPROT  --   --  19.2*  --  20.7*  --   --  19.0*  INR  --   --  1.7*  --  1.9*  --   --  1.7*  HEPARINUNFRC 0.46   < >  --  0.52 0.30  --   --  0.19*  CREATININE 0.78  --   --   --  0.64  --   --  0.68   < > = values in this interval not displayed.   Estimated Creatinine Clearance: 66.7 mL/min (by C-G formula based on SCr of 0.68 mg/dL).  Medical History: Past Medical History:  Diagnosis Date  . Epilepsy (HCC)      Assessment: H/H downtrended, PLTs 12/05/19.  Initiating Heparin for VTE treatment.  Pt transitioned from subtherapeutic warfarin to Heparin infusion while inpatient for treatment of pre-existing non-acute PE visualized on imaging.  RE-start Warfarin 11/13:on Coumadin PTA. Left lower extremity Doppler ultrasound came back positive for DVT. Pt restarting warfarin with heparin bridge.  Pt on warfarin 7 mg PO daily per nursing home MAR; pt also on phenobarbital and phenytoin currently and PTA. Last warfarin dose 11/9 per med rec.   Baseline INR = 1.4, aPTT = < 24.  Date TIME HL Dosing Rate 11/11 0810 0.54 1200 units/hr 11/11 1546 0.76  11/12 0148 0.85 1100 units/hr 11/12 1241 0.53 900 units/hr 11/12 2050 0.24 ----  11/13  0558 0.46 Therapeutic on 1050 units/hr 11/13   1357    0.52     Therapeutic on  1050 units/hr 11/15 0412 0.19 Subtherapeutic. Increased to 1300 units/hr.   WARFARIN DATE INR DOSE 11/13   1.7 Warfarin 7 mg 11/14 1.9 Warfarin 6mg  11/15 1.7   DDI: phenobarb, augmentin  Goal of Therapy:  INR 2.0-3.0 Heparin level 0.3-0.7 units/ml Monitor platelets by anticoagulation protocol: Yes   Plan:  Heparin: 11/15 HL @ 0412 = 0.19. SUBtherapeutic.  CBC stable. Heparin increased  to 1300 units/hr units/hr. Will recheck HL @ 1400 and CBC daily while on heparin drip. Bridging with Warfarin  Warfarin: INR 1.7. Subtherapeutic.Will order Warfarin 7 mg x 1 tonight.  F/u INR in am.   12/15, PharmD, BCPS Clinical Pharmacist 12/03/2019 8:13 AM

## 2019-12-05 LAB — URINE CULTURE: Culture: 20000 — AB

## 2019-12-05 LAB — 25-HYDROXY VITAMIN D LCMS D2+D3
25-Hydroxy, Vitamin D-2: <1 ng/mL
25-Hydroxy, Vitamin D-3: 44 ng/mL
25-Hydroxy, Vitamin D: 44 ng/mL

## 2020-11-29 IMAGING — US US EXTREM LOW VENOUS*L*
1 series · 14 of 24 positions shown · non-contrast
Comparison: None.

CLINICAL DATA: Leg pain

EXAM:
LEFT LOWER EXTREMITY VENOUS DOPPLER ULTRASOUND
TECHNIQUE: Gray-scale sonography with compression, as well as color and duplex
ultrasound, were performed to evaluate the deep venous system(s)
from the level of the common femoral vein through the popliteal and
proximal calf veins.

[Series 1: us venous img lower uni left (dvt) · portal-venous · 14 of 36 slices shown]
[im 1/36]
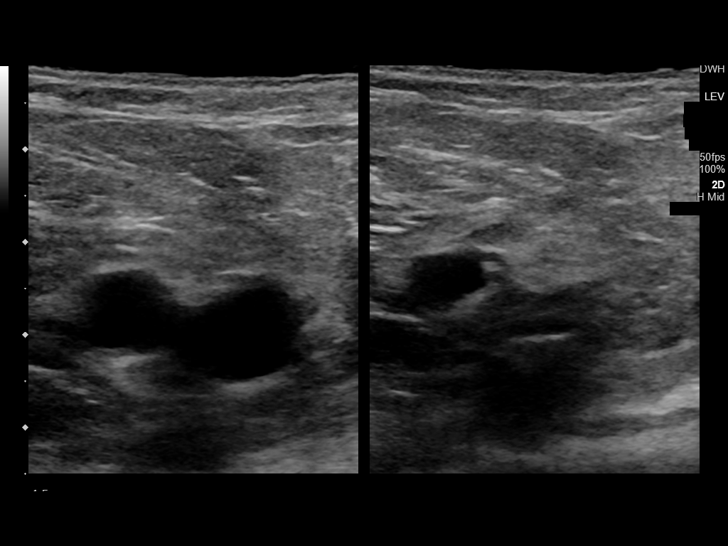
[im 4/36]
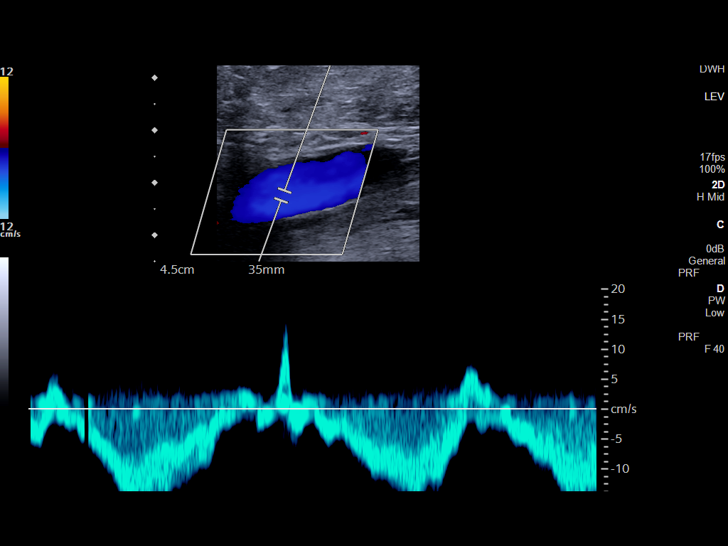
[im 7/36]
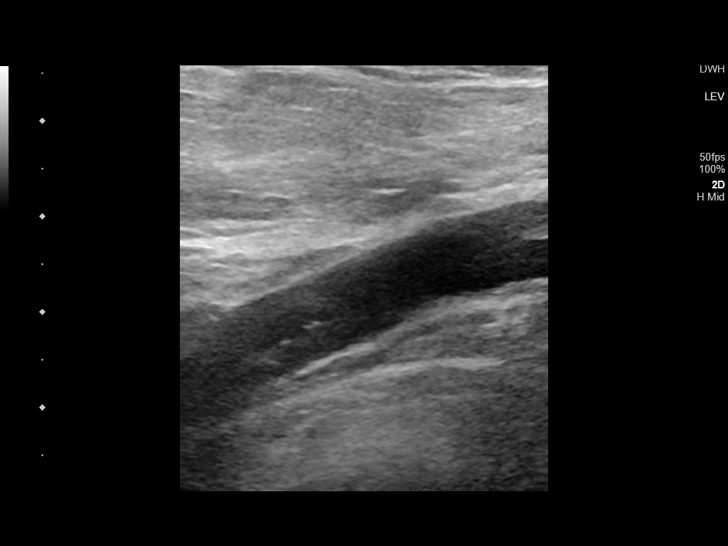
[im 10/36]
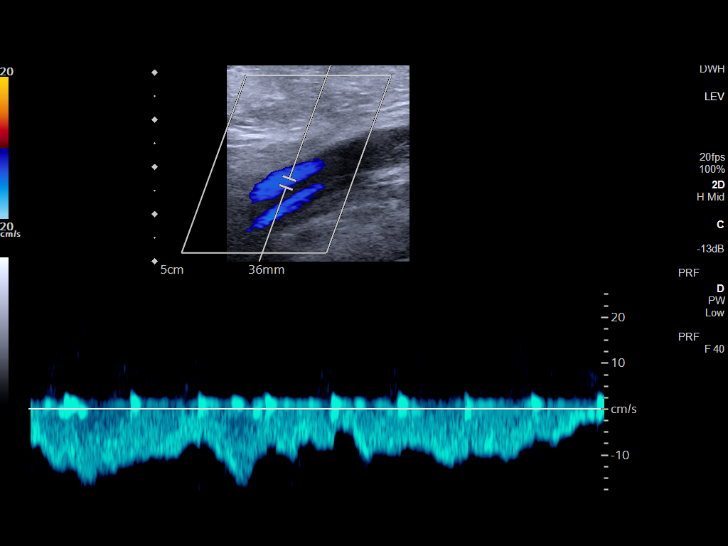
[im 11/36]
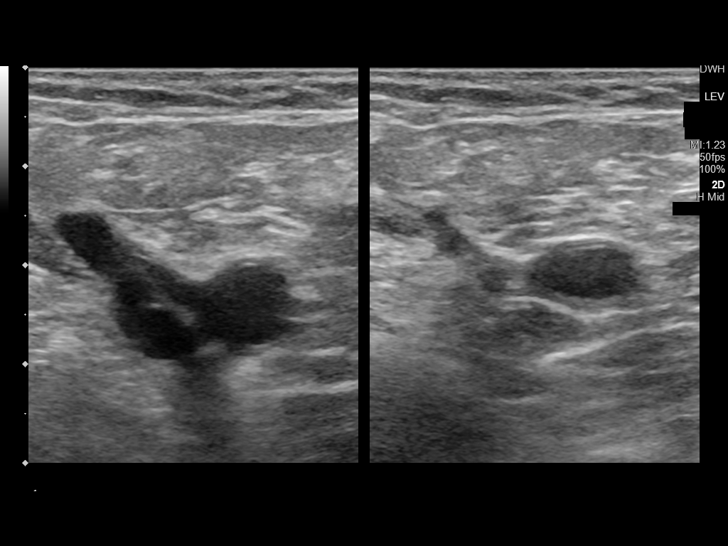
[im 14/36]
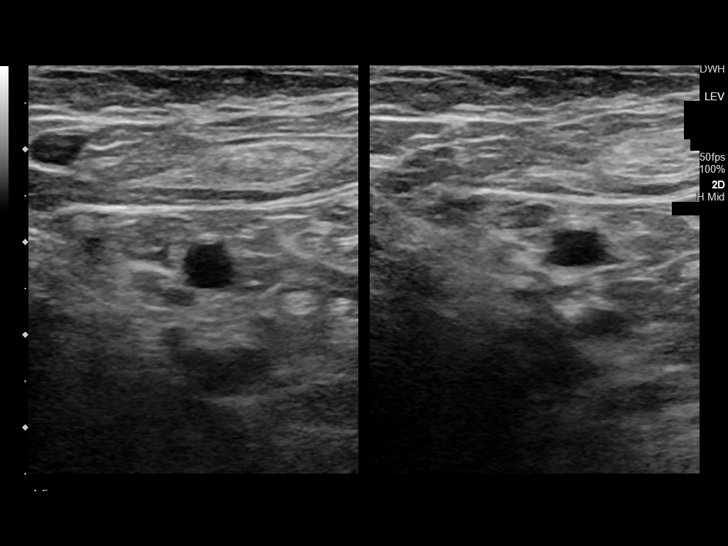
[im 17/36]
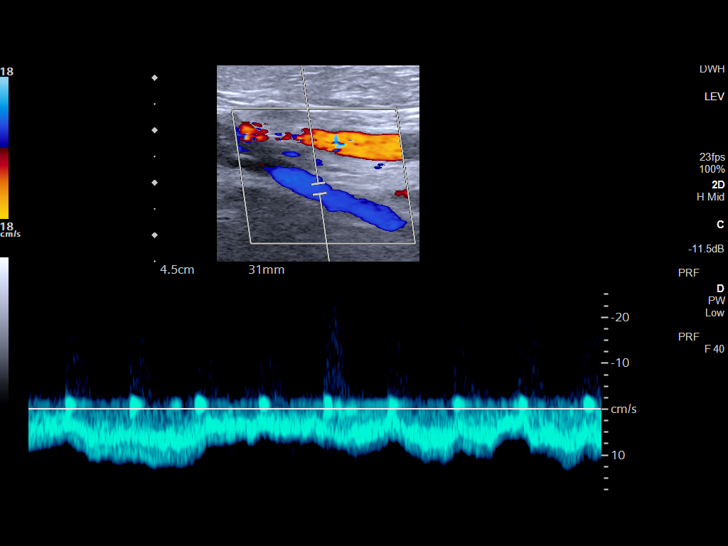
[im 19/36]
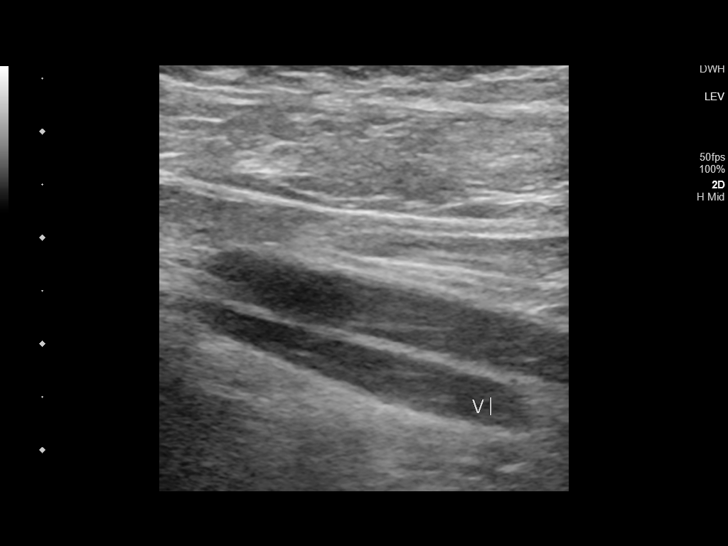
[im 22/36]
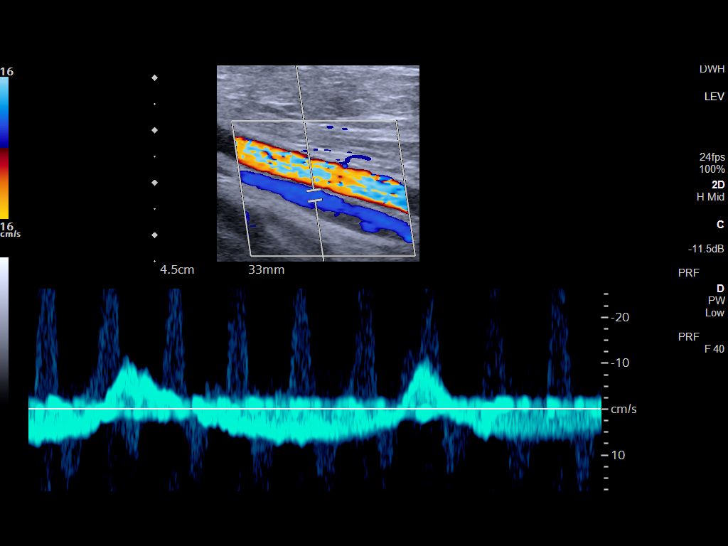
[im 25/36]
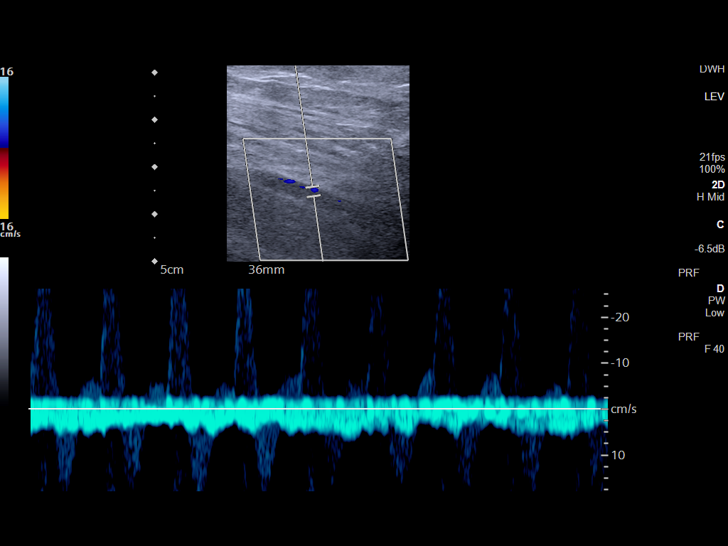
[im 28/36]
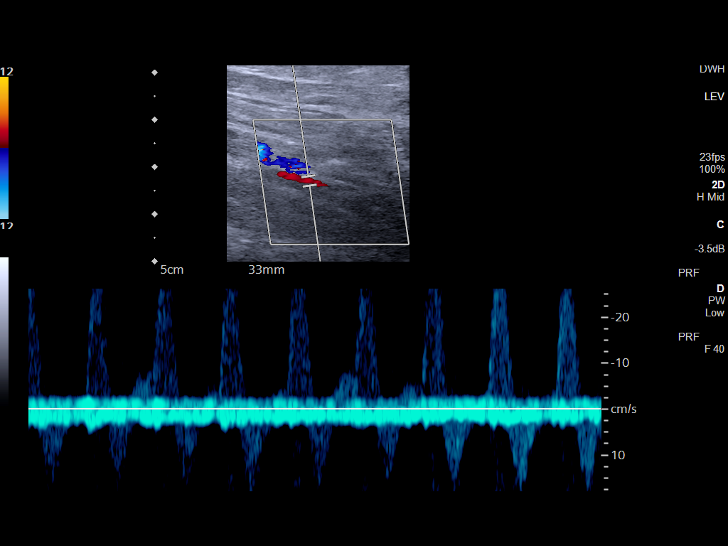
[im 29/36]
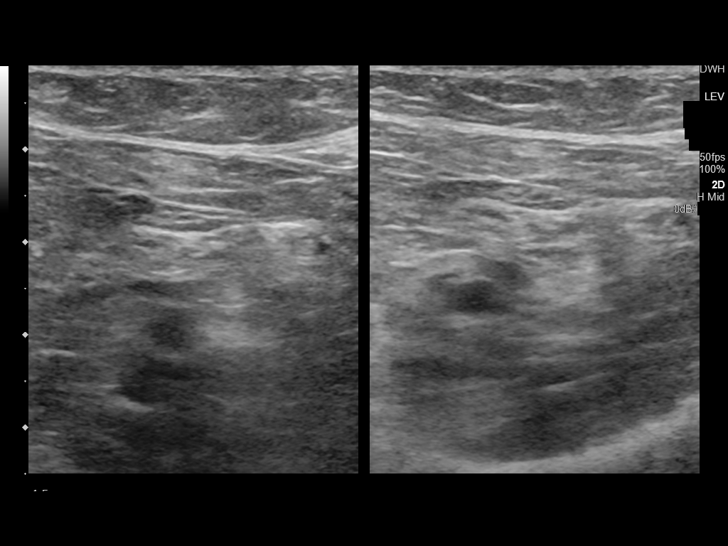
[im 32/36]
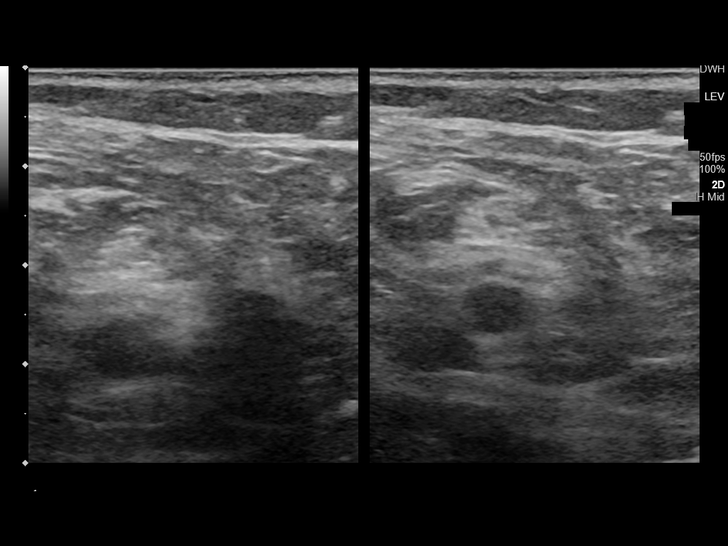
[im 36/36]
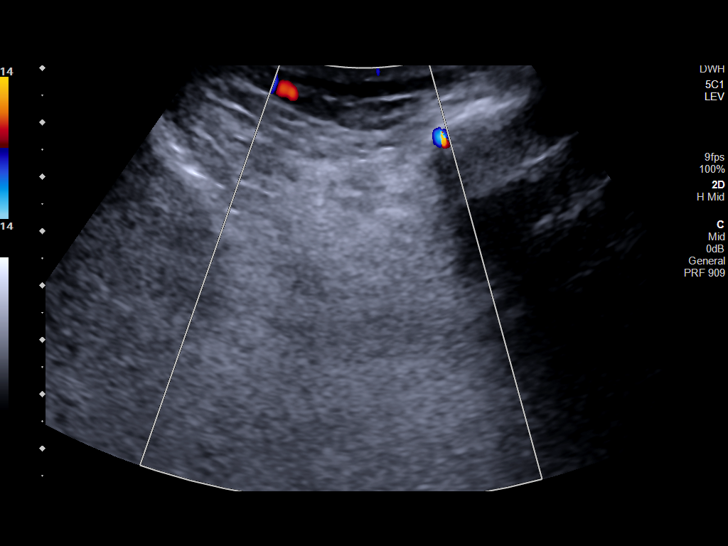

[14 of 24 positions shown; findings below may reference images not displayed]

FINDINGS: VENOUS

Nonocclusive thrombus is noted throughout the left common femoral
vein, femoral vein, and popliteal vein. The waveforms within the
femoral vein and common femoral vein are unremarkable. There is
decreased phasicity within the popliteal vein consistent with a more
proximal thrombus. The tibial vasculature was not well visualized on
this study.

Limited views of the contralateral common femoral vein are
unremarkable.

OTHER

None.

Limitations: none
IMPRESSION: Nonocclusive DVT throughout the left lower extremity as detailed
above.

## 2020-11-29 IMAGING — DX DG CHEST 1V PORT
1 series · 1 of 1 positions shown · non-contrast
Comparison: 03/30/2017

CLINICAL DATA: Questionable sepsis.  Evaluate for pneumonia.

EXAM:
PORTABLE CHEST 1 VIEW

[chest ap]
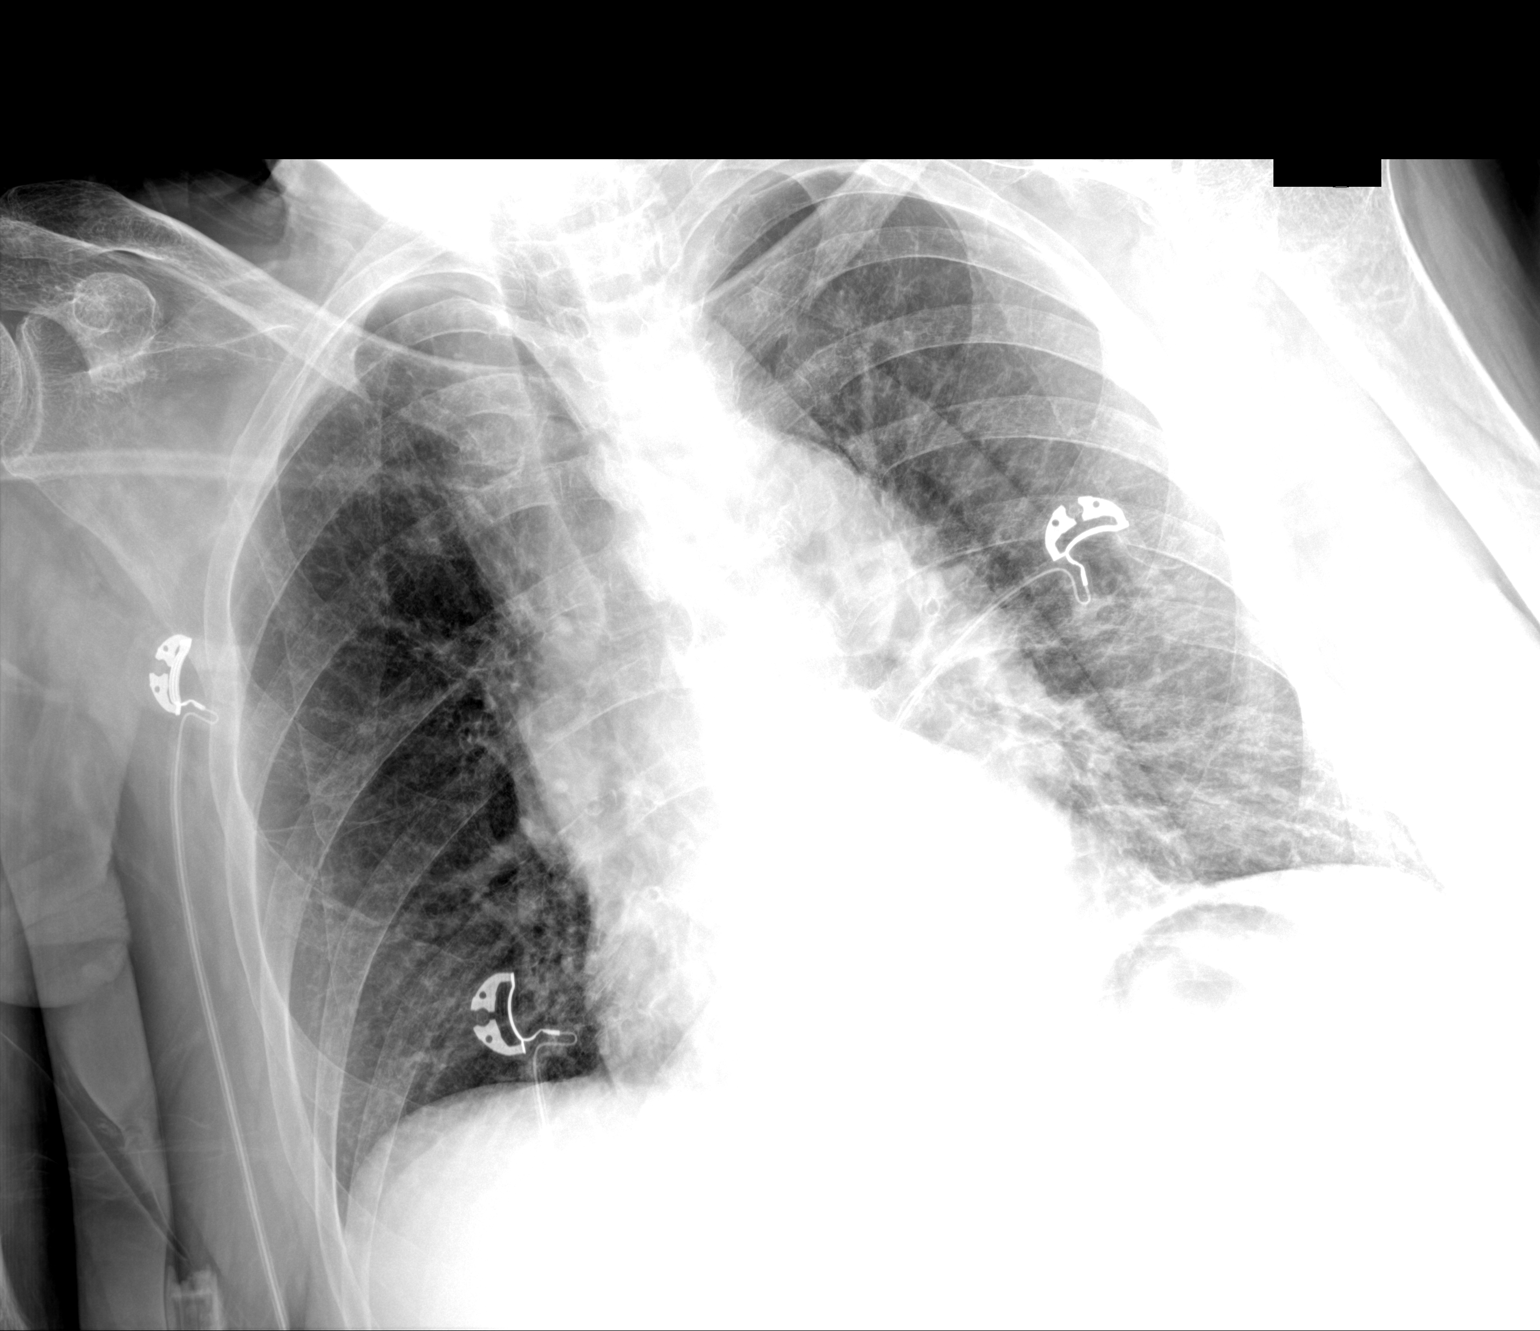

[1 of 1 positions shown; findings below may reference images not displayed]

FINDINGS: The stable cardiomediastinal contours. No pleural effusion
identified. No airspace consolidation identified to suggest
pneumonia. Healing left distal clavicle fracture.
IMPRESSION: No active disease.
